# Patient Record
Sex: Female | Born: 1955 | Race: White | Hispanic: No | Smoking: Never smoker
Health system: Southern US, Community
[De-identification: ages and names within clinical notes are randomized; demographics above are authoritative.]

## PROBLEM LIST (undated history)

## (undated) DIAGNOSIS — E785 Hyperlipidemia, unspecified: Secondary | ICD-10-CM

## (undated) DIAGNOSIS — J8410 Pulmonary fibrosis, unspecified: Secondary | ICD-10-CM

## (undated) DIAGNOSIS — G4730 Sleep apnea, unspecified: Secondary | ICD-10-CM

## (undated) DIAGNOSIS — J45909 Unspecified asthma, uncomplicated: Secondary | ICD-10-CM

## (undated) DIAGNOSIS — F32A Depression, unspecified: Secondary | ICD-10-CM

## (undated) DIAGNOSIS — E669 Obesity, unspecified: Secondary | ICD-10-CM

## (undated) DIAGNOSIS — J84115 Respiratory bronchiolitis interstitial lung disease: Secondary | ICD-10-CM

## (undated) DIAGNOSIS — E119 Type 2 diabetes mellitus without complications: Secondary | ICD-10-CM

---

## 2011-12-19 DIAGNOSIS — M359 Systemic involvement of connective tissue, unspecified: Secondary | ICD-10-CM | POA: Insufficient documentation

## 2013-03-06 DIAGNOSIS — Z6841 Body Mass Index (BMI) 40.0 and over, adult: Secondary | ICD-10-CM | POA: Insufficient documentation

## 2013-09-16 DIAGNOSIS — J9611 Chronic respiratory failure with hypoxia: Secondary | ICD-10-CM | POA: Insufficient documentation

## 2014-03-03 DIAGNOSIS — J8410 Pulmonary fibrosis, unspecified: Secondary | ICD-10-CM | POA: Insufficient documentation

## 2014-11-20 DIAGNOSIS — J8489 Other specified interstitial pulmonary diseases: Secondary | ICD-10-CM | POA: Insufficient documentation

## 2015-09-05 DIAGNOSIS — G4733 Obstructive sleep apnea (adult) (pediatric): Secondary | ICD-10-CM | POA: Insufficient documentation

## 2015-10-08 DIAGNOSIS — K219 Gastro-esophageal reflux disease without esophagitis: Secondary | ICD-10-CM | POA: Insufficient documentation

## 2015-10-08 DIAGNOSIS — E063 Autoimmune thyroiditis: Secondary | ICD-10-CM | POA: Insufficient documentation

## 2015-10-08 DIAGNOSIS — E1169 Type 2 diabetes mellitus with other specified complication: Secondary | ICD-10-CM | POA: Insufficient documentation

## 2015-10-08 DIAGNOSIS — E7800 Pure hypercholesterolemia, unspecified: Secondary | ICD-10-CM | POA: Insufficient documentation

## 2015-10-08 DIAGNOSIS — E785 Hyperlipidemia, unspecified: Secondary | ICD-10-CM | POA: Insufficient documentation

## 2015-10-29 DIAGNOSIS — R0789 Other chest pain: Secondary | ICD-10-CM | POA: Insufficient documentation

## 2015-10-29 DIAGNOSIS — R0602 Shortness of breath: Secondary | ICD-10-CM | POA: Insufficient documentation

## 2015-11-01 DIAGNOSIS — F333 Major depressive disorder, recurrent, severe with psychotic symptoms: Secondary | ICD-10-CM | POA: Insufficient documentation

## 2015-11-01 DIAGNOSIS — Z8659 Personal history of other mental and behavioral disorders: Secondary | ICD-10-CM | POA: Insufficient documentation

## 2015-11-01 DIAGNOSIS — F22 Delusional disorders: Secondary | ICD-10-CM | POA: Insufficient documentation

## 2015-12-06 DIAGNOSIS — E119 Type 2 diabetes mellitus without complications: Secondary | ICD-10-CM | POA: Insufficient documentation

## 2015-12-06 DIAGNOSIS — D72829 Elevated white blood cell count, unspecified: Secondary | ICD-10-CM | POA: Insufficient documentation

## 2015-12-06 DIAGNOSIS — R0600 Dyspnea, unspecified: Secondary | ICD-10-CM | POA: Insufficient documentation

## 2018-04-24 DIAGNOSIS — R042 Hemoptysis: Secondary | ICD-10-CM | POA: Insufficient documentation

## 2019-01-13 DIAGNOSIS — M8589 Other specified disorders of bone density and structure, multiple sites: Secondary | ICD-10-CM | POA: Insufficient documentation

## 2019-01-13 DIAGNOSIS — Z13820 Encounter for screening for osteoporosis: Secondary | ICD-10-CM | POA: Insufficient documentation

## 2019-01-24 ENCOUNTER — Other Ambulatory Visit: Payer: Self-pay | Admitting: Specialist

## 2019-01-24 ENCOUNTER — Ambulatory Visit
Admission: RE | Admit: 2019-01-24 | Discharge: 2019-01-24 | Disposition: A | Discharge status: Home / Self Care | Payer: Medicare Other | Source: Ambulatory Visit | Attending: Specialist | Admitting: Specialist

## 2019-01-24 ENCOUNTER — Ambulatory Visit: Payer: Self-pay

## 2019-01-24 ENCOUNTER — Other Ambulatory Visit: Payer: Self-pay

## 2019-01-24 DIAGNOSIS — R042 Hemoptysis: Secondary | ICD-10-CM

## 2019-01-24 DIAGNOSIS — R0602 Shortness of breath: Secondary | ICD-10-CM

## 2019-01-24 HISTORY — DX: Type 2 diabetes mellitus without complications: E11.9

## 2019-01-24 HISTORY — DX: Unspecified asthma, uncomplicated: J45.909

## 2019-01-24 LAB — POCT I-STAT CREATININE: Creatinine, Ser: 1 mg/dL (ref 0.44–1.00)

## 2019-01-24 MED ORDER — IOHEXOL 350 MG/ML SOLN
75.0000 mL | Freq: Once | INTRAVENOUS | Status: AC | PRN
  Administered 2019-01-24: 75 mL via INTRAVENOUS

## 2019-09-19 ENCOUNTER — Other Ambulatory Visit: Payer: Self-pay

## 2019-09-19 ENCOUNTER — Telehealth (INDEPENDENT_AMBULATORY_CARE_PROVIDER_SITE_OTHER): Payer: Medicare Other | Admitting: Psychiatry

## 2019-09-19 ENCOUNTER — Encounter: Payer: Self-pay | Admitting: Psychiatry

## 2019-09-19 DIAGNOSIS — F4000 Agoraphobia, unspecified: Secondary | ICD-10-CM | POA: Diagnosis not present

## 2019-09-19 DIAGNOSIS — Z8659 Personal history of other mental and behavioral disorders: Secondary | ICD-10-CM | POA: Diagnosis not present

## 2019-09-19 DIAGNOSIS — F330 Major depressive disorder, recurrent, mild: Secondary | ICD-10-CM | POA: Insufficient documentation

## 2019-09-19 MED ORDER — BUPROPION HCL ER (XL) 150 MG PO TB24: 300.0000 mg | ORAL_TABLET | Freq: Every day | ORAL | 1 refills | Status: DC
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
## 2019-09-21 ENCOUNTER — Other Ambulatory Visit: Payer: Self-pay

## 2019-09-21 ENCOUNTER — Ambulatory Visit (INDEPENDENT_AMBULATORY_CARE_PROVIDER_SITE_OTHER): Payer: Medicare Other | Admitting: Licensed Clinical Social Worker

## 2019-09-21 DIAGNOSIS — F333 Major depressive disorder, recurrent, severe with psychotic symptoms: Secondary | ICD-10-CM | POA: Diagnosis not present

## 2019-09-21 NOTE — Progress Notes (Signed)
Virtual Visit via Video Note  I connected with Denise Knapp on 09/21/19 at 12:30 PM EDT by a video enabled telemedicine application and verified that I am speaking with the correct person using two identifiers.  Location: Patient: daughter's home Provider: [facility]   I discussed the limitations of evaluation and management by telemedicine and the availability of in person appointments. The patient expressed understanding and agreed to proceed.   I discussed the assessment and treatment plan with the patient. The patient was provided an opportunity to ask questions and all were answered. The patient agreed with the plan and demonstrated an understanding of the instructions.   The patient was advised to call back or seek an in-person evaluation if the symptoms worsen or if the condition fails to improve as anticipated.  I provided of non-face-to-face time during this encounter.    R , LCSW    THERAPIST PROGRESS NOTE  Session Time: 45 min  Participation Level: Active  Behavioral Response: Casual and NeatAlertDepressed  Type of Therapy: Individual Therapy  Treatment Goals addressed: Coping  Interventions: Supportive  Summary: Denise Knapp is a 64 y.o. female who presents with improving depression symptoms.  Pt feels medication changes are managing depression well. Allowed pt to explore interests and pleasurable activities.  Helped pt identify stress triggers: financial concerns, health concerns, and inability to clean home as well as she would like. Allowed pt to express thoughts and feelings triggered by divorce 29 years ago. Pt reports that she is very family-oriented and enjoys spending time with her mother, daughters, and grandchildren.  Encouraged self-care and cognitively stimulating activities. Follow up after daughter/new granddaughter visits in July. Pt will call back sooner prn. Revised tx plan.   Suicidal/Homicidal: No  Therapist Response: Pt  progressing well towards achieving goals of overall mood management. Pt reports more positive mood and increase in energy levels.  Plan: Return again in 6 weeks.  Diagnosis: Axis I: Major Depression, Recurrent severe    Axis II: No diagnosis    [name] , LCSW 09/21/2019

## 2019-09-28 ENCOUNTER — Telehealth: Payer: Self-pay

## 2019-09-28 DIAGNOSIS — F330 Major depressive disorder, recurrent, mild: Secondary | ICD-10-CM

## 2019-09-28 DIAGNOSIS — Z8659 Personal history of other mental and behavioral disorders: Secondary | ICD-10-CM

## 2019-09-28 NOTE — Telephone Encounter (Signed)
pt called states she on fluoxetine 20mg  and she has #47 pills left and she also take risperidon 2mg  and has #47 pill left .

## 2019-09-29 MED ORDER — FLUOXETINE HCL 20 MG PO CAPS: 20.0000 mg | ORAL_CAPSULE | Freq: Every day | ORAL | 0 refills | Status: DC

## 2019-09-29 MED ORDER — RISPERIDONE 2 MG PO TABS: 2.0000 mg | ORAL_TABLET | Freq: Every day | ORAL | 0 refills | Status: DC

## 2019-09-29 NOTE — Telephone Encounter (Signed)
I have sent refills for Prozac and risperidone to [facility].

## 2019-10-24 ENCOUNTER — Encounter: Admission: RE | Payer: Self-pay | Source: Home / Self Care

## 2019-10-24 ENCOUNTER — Ambulatory Visit: Admission: RE | Admit: 2019-10-24 | Payer: Medicare Other | Source: Home / Self Care | Admitting: Internal Medicine

## 2019-10-24 SURGERY — ESOPHAGOGASTRODUODENOSCOPY (EGD) WITH PROPOFOL
Anesthesia: General

## 2019-11-10 ENCOUNTER — Telehealth (INDEPENDENT_AMBULATORY_CARE_PROVIDER_SITE_OTHER): Payer: Medicare Other | Admitting: Psychiatry

## 2019-11-10 ENCOUNTER — Other Ambulatory Visit: Payer: Self-pay

## 2019-11-10 ENCOUNTER — Encounter: Payer: Self-pay | Admitting: Psychiatry

## 2019-11-10 DIAGNOSIS — F09 Unspecified mental disorder due to known physiological condition: Secondary | ICD-10-CM

## 2019-11-10 DIAGNOSIS — F0390 Unspecified dementia without behavioral disturbance: Secondary | ICD-10-CM

## 2019-11-10 DIAGNOSIS — F330 Major depressive disorder, recurrent, mild: Secondary | ICD-10-CM

## 2019-11-10 MED ORDER — BUPROPION HCL ER (XL) 150 MG PO TB24: 300.0000 mg | ORAL_TABLET | Freq: Every day | ORAL | 0 refills | Status: DC

## 2019-11-10 MED ORDER — RISPERIDONE 0.5 MG PO TABS: 0.5000 mg | ORAL_TABLET | Freq: Every day | ORAL | 0 refills | Status: DC

## 2019-11-10 NOTE — Progress Notes (Signed)
Provider Location : [facility] Patient Location : Home Virtual Visit via Video Note  I connected with Denise Knapp on 11/10/19 at  9:30 AM EDT by a video enabled telemedicine application and verified that I am speaking with the correct person using two identifiers.   I discussed the limitations of evaluation and management by telemedicine and the availability of in person appointments. The patient expressed understanding and agreed to proceed.    I discussed the assessment and treatment plan with the patient. The patient was provided an opportunity to ask questions and all were answered. The patient agreed with the plan and demonstrated an understanding of the instructions.   The patient was advised to call back or seek an in-person evaluation if the symptoms worsen or if the condition fails to improve as anticipated.   [facility] OP Progress Note  11/10/2019 8:12 PM Denise Knapp  MRN:  [id number]  Chief Complaint:  Chief Complaint    Follow-up     HPI: Denise Knapp is a 64 year old Caucasian female, retired, lives in [place], on [place], has a history of MDD, agoraphobia, hypothyroidism, obstructive sleep apnea, interstitial lung disease, diabetes melitis, pulmonary fibrosis, hyperlipidemia was evaluated by telemedicine today.  Patient reports since being on the higher dosage of Wellbutrin she has definitely noticed an improvement in her depressive symptoms.  She used to struggle with fatigue and tiredness during the day and also had excessive sleepiness during the day.  She reports since being on the Wellbutrin that has improved a lot.  Patient reports sleep is good.  Patient however today appeared to be preoccupied with possible paranoia.  She reports that her family members are always monitoring her closely to make sure she is mentally okay.  She reports that whenever she has conversations with someone in her family they go and talk about it to other family members.  She reports she has has to be very  careful about what she talks and reports that she is worried she might end up committed to a mental health facility if she does not talk the right words.  She reports she however is grateful for her family and reports her younger daughter, her mother as well as her older daughter who recently visited her are all supportive.  Patient denies any hallucinations.  Patient does report multiple medical problems and also reports short-term memory issues.  She is compliant on medications and denies side effects.  Patient denies any other concerns today.  Visit Diagnosis:    ICD-10-CM   1. MDD (major depressive disorder), recurrent episode, mild (HCC)  F33.0 risperiDONE (RISPERDAL) 0.5 MG tablet    buPROPion (WELLBUTRIN XL) 150 MG 24 hr tablet  2. 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 Cognitive disorder  F09     Past Psychiatric History: I have reviewed past psychiatric history from my progress note on 09/19/2019  Past Medical History:  Past Medical History:  Diagnosis Date  . Asthma   . Diabetes mellitus without complication ([facility])    History reviewed. No pertinent surgical history.  Family Psychiatric History: Reviewed family psychiatric history from my progress note on 09/19/2019  Family History:  Family History  Problem Relation Age of Onset  . Depression Mother   . Mental illness Daughter   . Mental illness Daughter     Social History: Reviewed social history from my progress note on 09/19/2019 Social History   Socioeconomic History  . Marital status: Legally Separated  Spouse name: Not on file  . Number of children: 2  . Years of education: Not on file  . Highest education level: Not on file  Occupational History  . Occupation: retired  Tobacco Use  . Smoking status: Never Smoker  . Smokeless tobacco: Never Used  Substance and Sexual Activity  . Alcohol use: Never  . Drug use: Never  . Sexual activity: Not Currently  Other  Topics Concern  . Not on file  Social History Narrative   Completed bachelors degree, Designer, jewellery   Worked for [employer] - for 30 years - SSD/SSI Claims work   On [employer]   Social Determinants of Radio broadcast assistant Strain:   . Difficulty of Paying Living Expenses:   Food Insecurity:   . Worried About Charity fundraiser in the Last Year:   . Arboriculturist in the Last Year:   Transportation Needs:   . Film/video editor (Medical):   Marland Kitchen Lack of Transportation (Non-Medical):   Physical Activity:   . Days of Exercise per Week:   . Minutes of Exercise per Session:   Stress:   . Feeling of Stress :   Social Connections:   . Frequency of Communication with Friends and Family:   . Frequency of Social Gatherings with Friends and Family:   . Attends Religious Services:   . Active Member of Clubs or Organizations:   . Attends Archivist Meetings:   Marland Kitchen Marital Status:     Allergies:  Allergies  Allergen Reactions  . Nintedanib Diarrhea and Nausea And Vomiting    Per pt report  . Penicillins Itching and Other (See Comments)    Yeast infection Pt reports no known drug allergies, but chart from [facility] shows Penicillins Reports hx of yeast infection during course of therapy. Yeast infection Yeast infection Pt reports no known drug allergies, but chart from [facility] shows Penicillins Pt reports no known drug allergies, but chart from [facility] shows Penicillins     Metabolic Disorder Labs: No results found for: HGBA1C, MPG No results found for: PROLACTIN No results found for: CHOL, TRIG, HDL, CHOLHDL, VLDL, LDLCALC No results found for: TSH  Therapeutic Level Labs: No results found for: LITHIUM No results found for: VALPROATE No components found for:  CBMZ  Current Medications: Current Outpatient Medications  Medication Sig Dispense Refill  . albuterol (ACCUNEB) 1.25 MG/3ML nebulizer solution     . albuterol (VENTOLIN HFA) 108 (90 Base) MCG/ACT inhaler  Inhale into the lungs.    Marland Kitchen alendronate (FOSAMAX) 35 MG tablet Take by mouth.    Marland Kitchen ascorbic acid (VITAMIN C) 100 MG tablet Take by mouth.    Marland Kitchen aspirin 81 MG EC tablet Take by mouth.    Marland Kitchen b complex vitamins tablet Take by mouth.    . Blood Glucose Monitoring Suppl (FIFTY50 GLUCOSE METER 2.0) w/Device KIT Use as instructed    . Blood Glucose Monitoring Suppl (ONETOUCH VERIO) w/Device KIT Use to test blood sugar    . budesonide (PULMICORT) 0.5 MG/2ML nebulizer solution     . budesonide (PULMICORT) 0.5 MG/2ML nebulizer solution Inhale into the lungs.    Marland Kitchen buPROPion (WELLBUTRIN XL) 150 MG 24 hr tablet Take 2 tablets (300 mg total) by mouth daily with breakfast. 180 tablet 0  . Calcium Carbonate-Vitamin D 600-200 MG-UNIT TABS Take by mouth.    . Cholecalciferol 25 MCG (1000 UT) capsule Take by mouth.    . Cysteamine Bitartrate (PROCYSBI) 300 MG  PACK Use 1 each once daily Use as instructed.  ACCU-CHEK AVIVA LANCETS WITH LANCING DEVICE E11.9    . Dulaglutide 1.5 MG/0.5ML SOPN Inject into the skin.    Marland Kitchen enalapril (VASOTEC) 2.5 MG tablet Take 2.5 mg by mouth daily.    . enalapril (VASOTEC) 2.5 MG tablet Take by mouth.    . ESBRIET 267 MG TABS     . famotidine (PEPCID) 40 MG tablet Take 40 mg by mouth daily.    Marland Kitchen FLUoxetine (PROZAC) 20 MG capsule Take 1 capsule (20 mg total) by mouth daily. 90 capsule 0  . fluticasone (FLONASE) 50 MCG/ACT nasal spray Place into the nose.    Marland Kitchen glucose blood (ONETOUCH VERIO) test strip 1 each (1 strip total) by XX route once daily Use as instructed.    Marland Kitchen glucose blood (PRECISION QID TEST) test strip by Other route 2 (two) times a day with meals. Please dispense 50 ultra fine lancets    . glucose blood (PRECISION QID TEST) test strip Use once daily Use as instructed.  ACCU-CHEK AVIVA PLUS TEST STRIPS E11.9    . guaifenesin (ROBITUSSIN) 100 MG/5ML syrup Take by mouth.    Marland Kitchen ibuprofen (ADVIL) 200 MG tablet Take by mouth.    . levothyroxine (SYNTHROID) 137 MCG tablet Take by  mouth.    . loperamide (IMODIUM) 2 MG capsule Take by mouth.    . lovastatin (MEVACOR) 40 MG tablet Take 40 mg by mouth daily.    . meclizine (ANTIVERT) 25 MG tablet Take by mouth.    . metFORMIN (GLUCOPHAGE) 500 MG tablet Take 500 mg by mouth 3 (three) times daily.    . metFORMIN (GLUCOPHAGE) 500 MG tablet TAKE 2 TABLETS BY MOUTH IN THE MORNING AND 1 TABLET IN THE EVENING    . Multiple Vitamin (MULTI-VITAMIN) tablet Take by mouth.    . mycophenolate (CELLCEPT) 500 MG tablet Take 1,000 mg by mouth 2 (two) times daily.    . mycophenolate (CELLCEPT) 500 MG tablet TAKE 2 TABLETS BY MOUTH 2 TIMES DAILY    . OFEV 150 MG CAPS     . omeprazole (PRILOSEC) 20 MG capsule Take by mouth.    Glory Rosebush Delica Lancets 11A MISC     . OneTouch Delica Lancets 57X MISC Use 1 each once daily Use as instructed.    Glory Rosebush ULTRA test strip     . ONETOUCH VERIO test strip 1 each daily.    . Pirfenidone (ESBRIET) 267 MG TABS Take by mouth.    . predniSONE (DELTASONE) 10 MG tablet     . risperiDONE (RISPERDAL) 0.5 MG tablet Take 1 tablet (0.5 mg total) by mouth daily. Take in combination with 2 mg daily 90 tablet 0  . risperiDONE (RISPERDAL) 2 MG tablet Take 1 tablet (2 mg total) by mouth at bedtime. 90 tablet 0  . sodium chloride (OCEAN) 0.65 % nasal spray Place into the nose.    . sulfamethoxazole-trimethoprim (BACTRIM DS) 800-160 MG tablet TAKE 1 TABLET(160MG OF     TRIMETHOPRIM TOTAL) THREE  TIMES WEEKLY    . TRULICITY 1.5 UX/8.3FX SOPN SMARTSIG:0.5 Milliliter(s) SUB-Q Once a Week    . Vitamin D, Ergocalciferol, (DRISDOL) 1.25 MG (50000 UNIT) CAPS capsule      No current facility-administered medications for this visit.     Musculoskeletal: Strength & Muscle Tone: UTA Gait & Station: normal Patient leans: N/A  Psychiatric Specialty Exam: Review of Systems  Constitutional: Positive for fatigue.  Psychiatric/Behavioral: Positive for dysphoric mood.  Negative for agitation, behavioral problems,  confusion, decreased concentration, hallucinations, self-injury, sleep disturbance and suicidal ideas. The patient is nervous/anxious.   All other systems reviewed and are negative.   There were no vitals taken for this visit.There is no height or weight on file to calculate BMI.  General Appearance: Casual  Eye Contact:  Fair  Speech:  Normal Rate  Volume:  Normal  Mood:  Anxious and Depressed improving  Affect:  Appropriate  Thought Process:  Goal Directed and Descriptions of Associations: Intact  Orientation:  Full (Time, Place, and Person)  Thought Content: Paranoid Ideation and Rumination   Suicidal Thoughts:  No  Homicidal Thoughts:  No  Memory:  Immediate;   Fair Recent;   Fair Remote;   Fair  Judgement:  Fair  Insight:  Fair  Psychomotor Activity:  Normal  Concentration:  Concentration: Fair and Attention Span: Fair  Recall:  [employer] of Knowledge: Fair  Language: Fair  Akathisia:  No  Handed:  Right  AIMS (if indicated): UTA  Assets:  Communication Skills Desire for Improvement Social Support  ADL's:  Intact  Cognition: WNL  Sleep:  Fair   Screenings: PHQ2-9     Video Visit from 09/19/2019 in [facility]  PHQ-2 Total Score 1  PHQ-9 Total Score 11       Assessment and Plan: Tyjah Hai is a 64 year old divorced, on SSD, Caucasian female, lives in [place] has a history of MDD with psychosis, pulmonary fibrosis, hypothyroidism, interstitial lung disease, obstructive sleep apnea, diabetes melitis, hyperlipidemia, morbid obesity was evaluated by telemedicine today.  She is biologically predisposed given her family history, multiple health issues and her history of trauma.  Patient with psychosocial stressors of health problems, current pandemic and relationship struggles.  Patient continues to make progress on the current medication regimen however today appeared to be preoccupied with paranoid ideation, unknown if this is chronic.   Patient however could be redirected and at this time does not seem to be a danger to herself or anyone else.  Discussed plan as noted below.  Plan MDD-improving Prozac 20 mg p.o. daily Risperidone, will increase to 2.5 mg p.o. nightly.   Psychosis unspecified-unstable Unknown if this is due to her depression, or due to cognitive issues. Increase risperidone to 2.5 mg p.o. nightly We will consider getting collateral information from her daughter.  Patient agrees to have her daughter during the next session.  Cognitive disorder unspecified-rule out dementia Will refer to neurology. Unknown if her current paranoia is related to early cognitive changes.   Patient to continue psychotherapy sessions with therapist Ms.Hussami.  Follow-up in clinic in 4 weeks or sooner if needed.  I have spent atleast 20 minutes non face to face with patient today. More than 50 % of the time was spent for preparing to see the patient ( e.g., review of test, records ),  ordering medications and test ,psychoeducation and supportive psychotherapy and care coordination,as well as documenting clinical information in electronic health record. This note was generated in part or whole with voice recognition software. Voice recognition is usually quite accurate but there are transcription errors that can and very often do occur. I apologize for any typographical errors that were not detected and corrected.       Ursula Alert, MD 11/10/2019, 8:12 PM

## 2019-11-11 ENCOUNTER — Telehealth: Payer: Self-pay

## 2019-11-11 NOTE — Telephone Encounter (Signed)
faxed and confirmed orders for new pt referral  Pending appt

## 2019-11-24 ENCOUNTER — Ambulatory Visit: Payer: Medicare Other | Admitting: Licensed Clinical Social Worker

## 2019-11-28 ENCOUNTER — Ambulatory Visit (INDEPENDENT_AMBULATORY_CARE_PROVIDER_SITE_OTHER): Payer: Medicare Other | Admitting: Licensed Clinical Social Worker

## 2019-11-28 ENCOUNTER — Other Ambulatory Visit: Payer: Self-pay

## 2019-11-28 DIAGNOSIS — F330 Major depressive disorder, recurrent, mild: Secondary | ICD-10-CM | POA: Diagnosis not present

## 2019-11-28 DIAGNOSIS — Z8659 Personal history of other mental and behavioral disorders: Secondary | ICD-10-CM | POA: Diagnosis not present
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
## 2019-11-29 ENCOUNTER — Other Ambulatory Visit: Payer: Self-pay | Admitting: Obstetrics & Gynecology

## 2019-11-29 DIAGNOSIS — Z1231 Encounter for screening mammogram for malignant neoplasm of breast: Secondary | ICD-10-CM

## 2019-12-12 ENCOUNTER — Other Ambulatory Visit: Payer: Self-pay

## 2019-12-12 ENCOUNTER — Ambulatory Visit (INDEPENDENT_AMBULATORY_CARE_PROVIDER_SITE_OTHER): Payer: Medicare Other | Admitting: Licensed Clinical Social Worker

## 2019-12-12 DIAGNOSIS — F330 Major depressive disorder, recurrent, mild: Secondary | ICD-10-CM
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
## 2019-12-21 ENCOUNTER — Other Ambulatory Visit: Payer: Self-pay | Admitting: Psychiatry

## 2019-12-21 DIAGNOSIS — F330 Major depressive disorder, recurrent, mild: Secondary | ICD-10-CM

## 2019-12-23 ENCOUNTER — Other Ambulatory Visit: Payer: Self-pay

## 2019-12-23 ENCOUNTER — Encounter: Payer: Self-pay | Admitting: Psychiatry

## 2019-12-23 ENCOUNTER — Telehealth (INDEPENDENT_AMBULATORY_CARE_PROVIDER_SITE_OTHER): Payer: Medicare Other | Admitting: Psychiatry

## 2019-12-23 DIAGNOSIS — F0390 Unspecified dementia without behavioral disturbance: Secondary | ICD-10-CM

## 2019-12-23 DIAGNOSIS — F330 Major depressive disorder, recurrent, mild: Secondary | ICD-10-CM | POA: Diagnosis not present

## 2019-12-23 DIAGNOSIS — F09 Unspecified mental disorder due to known physiological condition: Secondary | ICD-10-CM | POA: Diagnosis not present

## 2019-12-23 NOTE — Progress Notes (Signed)
Provider Location : [facility] Patient Location : Home  Participants: Patient , Provider  Virtual Visit via Telephone Note  I connected with Denise Knapp on 12/23/19 at 11:40 AM EDT by telephone and verified that I am speaking with the correct person using two identifiers.   I discussed the limitations, risks, security and privacy concerns of performing an evaluation and management service by telephone and the availability of in person appointments. I also discussed with the patient that there may be a patient responsible charge related to this service. The patient expressed understanding and agreed to proceed.    I discussed the assessment and treatment plan with the patient. The patient was provided an opportunity to ask questions and all were answered. The patient agreed with the plan and demonstrated an understanding of the instructions.   The patient was advised to call back or seek an in-person evaluation if the symptoms worsen or if the condition fails to improve as anticipated.    [place] [facility] MD OP Progress Note  12/23/2019 11:55 AM Denise Knapp  MRN:  [id number]  Chief Complaint:  Chief Complaint    Follow-up     HPI: Denise Knapp is a 64 year old Caucasian female, retired, lives in [place], on [place], has a history of MDD, opiate, hypothyroidism, obstructive sleep apnea, interstitial lung disease, diabetes melitis, fibrosis, hyperlipidemia was evaluated by phone today.  Patient today reports her mood symptoms as improving on the current medication regimen.  She is tolerating the risperidone well.  She reports the higher dosage helps her to sleep better at night.  She denies any suicidality, homicidality or hallucinations.  She does have paranoia however she today reports that is chronic.  She reports she has felt that way all her life.  She reports she does not believe she is currently unable to cope with these thoughts and is able to manage it well.  She often believes people are talking to  her to get her to speak and then go and communicate what she spoke about to other family members.  She reports she manages by staying away and not talking to people much.  She is currently having some relationship struggles with her daughter who lives in town.  She however reports she is coping with it and is hopeful it will get better.  Patient apologized for not having her daughter during her call today.  She however agrees to have her daughter present for more collateral information during her next visit.  She continues to be in therapy.  Patient denies any other concerns today.    Visit Diagnosis: R/O Schizoaffective disorder   ICD-10-CM   1. MDD (major depressive disorder), recurrent episode, mild ([place])  F33.0   2. 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 Cognitive disorder  F09     Past Psychiatric History: I have reviewed past psychiatric history progress note on 09/19/2019  Past Medical History:  Past Medical History:  Diagnosis Date  . Asthma   . Diabetes mellitus without complication ([place])    No past surgical history on file.  Family Psychiatric History: I have reviewed family psychiatric history from my progress note from 09/19/2019  Family History:  Family History  Problem Relation Age of Onset  . Depression Mother   . Mental illness Daughter   . Mental illness Daughter     Social History: Reviewed social history from my progress note from 09/19/2019 Social History   Socioeconomic History  . Marital status: Legally Separated  Spouse name: Not on file  . Number of children: 2  . Years of education: Not on file  . Highest education level: Not on file  Occupational History  . Occupation: retired  Tobacco Use  . Smoking status: Never Smoker  . Smokeless tobacco: Never Used  Substance and Sexual Activity  . Alcohol use: Never  . Drug use: Never  . Sexual activity: Not Currently  Other Topics Concern  . 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 Difficulty of Paying Living Expenses: Somewhat hard  Food Insecurity:   . Worried About Charity fundraiser in the Last Year: Not on file  . Ran Out of Food in the Last Year: Not on file  Transportation Needs:   . Lack of Transportation (Medical): Not on file  . Lack of Transportation (Non-Medical): Not on file  Physical Activity:   . Days of Exercise per Week: Not on file  . Minutes of Exercise per Session: Not on file  Stress:   . Feeling of Stress : Not on file  Social Connections:   . Frequency of Communication with Friends and Family: Not on file  . Frequency of Social Gatherings with Friends and Family: Not on file  . Attends Religious Services: Not on file  . Active Member of Clubs or Organizations: Not on file  . Attends Archivist Meetings: Not on file  . Marital Status: Not on file    Allergies:  Allergies  Allergen Reactions  . Nintedanib Diarrhea and Nausea And Vomiting    Per pt report  . Penicillins Itching and Other (See Comments)    Yeast infection Pt reports no known drug allergies, but chart from [facility] shows Penicillins Reports hx of yeast infection during course of therapy. Yeast infection Yeast infection Pt reports no known drug allergies, but chart from [facility] shows Penicillins Pt reports no known drug allergies, but chart from [facility] shows Penicillins     Metabolic Disorder Labs: No results found for: HGBA1C, MPG No results found for: PROLACTIN No results found for: CHOL, TRIG, HDL, CHOLHDL, VLDL, LDLCALC No results found for: TSH  Therapeutic Level Labs: No results found for: LITHIUM No results found for: VALPROATE No components found for:  CBMZ  Current Medications: Current Outpatient Medications  Medication Sig Dispense  Refill  . albuterol (ACCUNEB) 1.25 MG/3ML nebulizer solution     . albuterol (VENTOLIN HFA) 108 (90 Base) MCG/ACT inhaler Inhale into the lungs.    [place] Kitchen alendronate (FOSAMAX) 35 MG tablet Take by mouth.    Mar[place] Kitchen amoxicillin (AMOXIL) 500 MG capsule Take 500 mg by mouth 3 (three) times daily.    Marland Kitchen ascorbic acid (VITAMIN C) 100 MG tablet Take by mouth.    Marland Kitchen aspirin 81 MG EC tablet Take by mouth.    Marland Kitchen b complex vitamins tablet Take by mouth.    . Blood Glucose Monitoring Suppl (FIFTY50 GLUCOSE METER 2.0) w/Device KIT Use as instructed    . Blood Glucose Monitoring Suppl (ONETOUCH VERIO) w/Device KIT Use to test blood sugar    . budesonide (PULMICORT) 0.5 MG/2ML nebulizer solution     . budesonide (PULMICORT) 0.5 MG/2ML nebulizer solution Inhale into the lungs.    Mar[place] Kitchen buPROPion (WELLBUTRIN XL) 150 MG 24 hr tablet Take  2 tablets (300 mg total) by mouth daily with breakfast. 180 tablet 0  . Calcium Carbonate-Vitamin D 600-200 MG-UNIT TABS Take by mouth.    . Cholecalciferol 25 MCG (1000 UT) capsule Take by mouth.    . Cysteamine Bitartrate (PROCYSBI) 300 MG PACK Use 1 each once daily Use as instructed.  ACCU-CHEK AVIVA LANCETS WITH LANCING DEVICE E11.9    . Dulaglutide 1.5 MG/0.5ML SOPN Inject into the skin.    Mar[place] Kitchen enalapril (VASOTEC) 2.5 MG tablet Take 2.5 mg by mouth daily.    . enalapril (VASOTEC) 2.5 MG tablet Take by mouth.    . ESBRIET 267 MG TABS     . famotidine (PEPCID) 40 MG tablet Take 40 mg by mouth daily.    . fluconazole (DIFLUCAN) 100 MG tablet Take by mouth.    Marland Kitchen FLUoxetine (PROZAC) 20 MG capsule TAKE 1 CAPSULE BY MOUTH EVERY DAY 90 capsule 0  . fluticasone (FLONASE) 50 MCG/ACT nasal spray Place into the nose.    Marland Kitchen glucose blood (ONETOUCH VERIO) test strip 1 each (1 strip total) by XX route once daily Use as instructed.    Mar[place] Kitchen glucose blood (PRECISION QID TEST) test strip by Other route 2 (two) times a day with meals. Please dispense 50 ultra fine lancets    . glucose blood (PRECISION QID TEST)  test strip Use once daily Use as instructed.  ACCU-CHEK AVIVA PLUS TEST STRIPS E11.9    . guaifenesin (ROBITUSSIN) 100 MG/5ML syrup Take by mouth.    Mar[place] Kitchen ibuprofen (ADVIL) 200 MG tablet Take by mouth.    . levothyroxine (SYNTHROID) 137 MCG tablet Take by mouth.    . loperamide (IMODIUM) 2 MG capsule Take by mouth.    . lovastatin (MEVACOR) 40 MG tablet Take 40 mg by mouth daily.    . meclizine (ANTIVERT) 25 MG tablet Take by mouth.    . metFORMIN (GLUCOPHAGE) 500 MG tablet Take 500 mg by mouth 3 (three) times daily.    . metFORMIN (GLUCOPHAGE) 500 MG tablet TAKE 2 TABLETS BY MOUTH IN THE MORNING AND 1 TABLET IN THE EVENING    . Multiple Vitamin (MULTI-VITAMIN) tablet Take by mouth.    . mycophenolate (CELLCEPT) 500 MG tablet Take 1,000 mg by mouth 2 (two) times daily.    . mycophenolate (CELLCEPT) 500 MG tablet TAKE 2 TABLETS BY MOUTH 2 TIMES DAILY    . OFEV 150 MG CAPS     . omeprazole (PRILOSEC) 20 MG capsule Take by mouth.    Denise Knapp     . OneTouch Delica Lancets 84X Knapp Use 1 each once daily Use as instructed.    Denise Rosebush ULTRA test strip     . ONETOUCH VERIO test strip 1 each daily.    . Pirfenidone (ESBRIET) 267 MG TABS Take by mouth.    . predniSONE (DELTASONE) 10 MG tablet     . risperiDONE (RISPERDAL) 0.5 MG tablet Take 1 tablet (0.5 mg total) by mouth daily. Take in combination with 2 mg daily 90 tablet 0  . risperiDONE (RISPERDAL) 2 MG tablet Take 1 tablet (2 mg total) by mouth at bedtime. 90 tablet 0  . sodium chloride (OCEAN) 0.65 % nasal spray Place into the nose.    . sulfamethoxazole-trimethoprim (BACTRIM DS) 800-160 MG tablet TAKE 1 TABLET(160MG OF     TRIMETHOPRIM TOTAL) THREE  TIMES WEEKLY    . traMADol (ULTRAM) 50 MG tablet Take 50 mg by mouth every 6 (six) hours as  needed.    . TRULICITY 1.5 PP/5.0DT SOPN SMARTSIG:0.5 Milliliter(s) SUB-Q Once a Week    . Vitamin D, Ergocalciferol, (DRISDOL) 1.25 MG (50000 UNIT) CAPS capsule      No current  facility-administered medications for this visit.     Musculoskeletal: Strength & Muscle Tone: UTA Gait & Station: UTA Patient leans: N/A  Psychiatric Specialty Exam: Review of Systems  Psychiatric/Behavioral: Negative for agitation, behavioral problems, confusion, decreased concentration, dysphoric mood, hallucinations, self-injury, sleep disturbance and suicidal ideas. The patient is not nervous/anxious and is not hyperactive.   All other systems reviewed and are negative.   There were no vitals taken for this visit.There is no height or weight on file to calculate BMI.  General Appearance: UTA  Eye Contact:  UTA  Speech:  Clear and Coherent  Volume:  Normal  Mood:  Euthymic  Affect:  UTA  Thought Process:  Goal Directed and Descriptions of Associations: Intact  Orientation:  Full (Time, Place, and Person)  Thought Content: Paranoid Ideation chronic  Suicidal Thoughts:  No  Homicidal Thoughts:  No  Memory:  Immediate;   Fair Recent;   Fair Remote;   Fair  Judgement:  Fair  Insight:  Fair  Psychomotor Activity:  UTA  Concentration:  Concentration: Fair and Attention Span: Fair  Recall:  [employer] of Knowledge: Fair  Language: Fair  Akathisia:  No  Handed:  Right  AIMS (if indicated): UTA  Assets:  Communication Skills Desire for Improvement Housing  ADL's:  Intact  Cognition: WNL  Sleep:  Fair   Screenings: GAD-7     Counselor from 11/28/2019 in [facility]  Total GAD-7 Score 13    PHQ2-9     Counselor from 11/28/2019 in [facility] Video Visit from 09/19/2019 in [facility]  PHQ-2 Total Score 1 1  PHQ-9 Total Score 10 11       Assessment and Plan: Denise Knapp is a 64 year old divorced, on SSD, patient.  Lives in [place] has a history of MDD), hypothyroidism, interstitial lung disease, obstructive sleep apnea, morbid obesity was evaluated by telemedicine today.  Patient  with biological predisposition given her family history, multiple health issues, history of trauma.  Patient with psychosocial stressors of health problems, current pandemic and relationship struggles.  Patient however continues to be preoccupied with paranoia, likely chronic.  Patient however is currently doing well on the current medication regimen.  Discussed plan as noted below.  Plan MDD-stable Prozac 20 mg p.o. daily Risperidone 2.5 mg nightly  Psychosis -rule out schizoaffective disorder. Will need collateral information however unable to reach family members.  Patient agrees to have her daughter present in session next visit. Risperidone 2.5 mg p.o. nightly   Cognitive disorder unspecified-rule out dementia Refered to neurology- pending  Continue psychotherapy sessions with Ms.Hussami.  Follow-up in clinic in 1 month or sooner if needed.  I have spent atleast 20 minutes face to face with patient today. More than 50 % of the time was spent for obtaining and to review and separately obtained history , ordering medications and test ,psychoeducation and supportive psychotherapy and care coordination,as well as documenting clinical information in electronic health record. This note was generated in part or whole with voice recognition software. Voice recognition is usually quite accurate but there are transcription errors that can and very often do occur. I apologize for any typographical errors that were not detected and corrected.      Ursula Alert, MD 12/23/2019,  11:55 AM

## 2020-01-03 ENCOUNTER — Ambulatory Visit (INDEPENDENT_AMBULATORY_CARE_PROVIDER_SITE_OTHER): Payer: Medicare Other | Admitting: Licensed Clinical Social Worker

## 2020-01-03 ENCOUNTER — Other Ambulatory Visit: Payer: Self-pay

## 2020-01-03 DIAGNOSIS — F330 Major depressive disorder, recurrent, mild: Secondary | ICD-10-CM | POA: Diagnosis not present

## 2020-01-03 NOTE — Progress Notes (Signed)
Virtual Visit via Telephone Note  I connected with Denise Knapp on 01/03/20 at 12:30 PM EDT by telephone and verified that I am speaking with the correct person using two identifiers.  Location: Patient: home Provider: [facility]   I discussed the limitations, risks, security and privacy concerns of performing an evaluation and management service by telephone and the availability of in person appointments. I also discussed with the patient that there may be a patient responsible charge related to this service. The patient expressed understanding and agreed to proceed.  The patient was advised to call back or seek an in-person evaluation if the symptoms worsen or if the condition fails to improve as anticipated.  I provided 30 minutes of non-face-to-face time during this encounter.    R , LCSW   THERAPIST PROGRESS NOTE  Session Time: 12:30-1:00p  Participation Level: Active  Behavioral Response: NAAlertgenerally pleasant  Type of Therapy: Individual Therapy  Treatment Goals addressed: Anxiety and Coping  Interventions: Supportive  Summary: Denise Knapp is a 64 y.o. female who presents with improving symptoms related to depression diagnosis. Pt reports that mood is stable and that she is managing anxiety symptoms better than in the past.   Allowed pt to explore and express thoughts and feelings about current stressors:  Pt worries about quality of daughter's marriages.  "they don't tell me anything at all about their relationships". Discussed relationship privacy and how others just prefer that family members not know their personal lives.   Pt reports that she has some physical limitations to leaving her home--can't go out without large oxygen tank.   Encouraged pt to continue focusing on maintaining levels of progress, self care, social engagement (family/friends) and cognitively stimulating activities/leaving the house when she can.  Suicidal/Homicidal: No  SI, HI, or  AVH reported at time of session.  Therapist Response: [name] reports that mood is stable with no active depression symptoms and that she is able to manage anxiety symptoms. This is indicative of progress.   Plan: Return again in 6 weeks. Ongoing treatment plan to include maintaining progress, improving coping skills, and focus on self care/life balance.   Diagnosis: Axis I: Major Depression, Recurrent mild    Axis II: No diagnosis    [name] , LCSW 01/03/2020

## 2020-01-24 ENCOUNTER — Telehealth: Payer: Self-pay

## 2020-01-24 DIAGNOSIS — F330 Major depressive disorder, recurrent, mild: Secondary | ICD-10-CM

## 2020-01-24 MED ORDER — BUPROPION HCL ER (XL) 150 MG PO TB24: 300.0000 mg | ORAL_TABLET | Freq: Every day | ORAL | 0 refills | Status: DC

## 2020-01-24 NOTE — Telephone Encounter (Signed)
pt called left message that she needs refill on bupropion

## 2020-01-24 NOTE — Telephone Encounter (Signed)
I have sent Wellbutrin to pharmacy. 

## 2020-01-31 ENCOUNTER — Other Ambulatory Visit: Payer: Self-pay | Admitting: Psychiatry

## 2020-01-31 DIAGNOSIS — F330 Major depressive disorder, recurrent, mild: Secondary | ICD-10-CM

## 2020-02-10 ENCOUNTER — Encounter: Payer: Self-pay | Admitting: Psychiatry

## 2020-02-10 ENCOUNTER — Telehealth (INDEPENDENT_AMBULATORY_CARE_PROVIDER_SITE_OTHER): Payer: Medicare Other | Admitting: Psychiatry

## 2020-02-10 ENCOUNTER — Other Ambulatory Visit: Payer: Self-pay

## 2020-02-10 DIAGNOSIS — F0390 Unspecified dementia without behavioral disturbance: Secondary | ICD-10-CM | POA: Diagnosis not present

## 2020-02-10 DIAGNOSIS — F330 Major depressive disorder, recurrent, mild: Secondary | ICD-10-CM

## 2020-02-10 DIAGNOSIS — F09 Unspecified mental disorder due to known physiological condition: Secondary | ICD-10-CM

## 2020-02-10 MED ORDER — RISPERIDONE 2 MG PO TABS: 2.0000 mg | ORAL_TABLET | Freq: Every day | ORAL | 0 refills | Status: DC

## 2020-02-10 MED ORDER — FLUOXETINE HCL 40 MG PO CAPS: 40.0000 mg | ORAL_CAPSULE | Freq: Every day | ORAL | 0 refills | Status: DC
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
## 2020-02-16 ENCOUNTER — Ambulatory Visit (INDEPENDENT_AMBULATORY_CARE_PROVIDER_SITE_OTHER): Payer: Medicare Other | Admitting: Licensed Clinical Social Worker

## 2020-02-16 ENCOUNTER — Other Ambulatory Visit: Payer: Self-pay

## 2020-02-16 DIAGNOSIS — F330 Major depressive disorder, recurrent, mild: Secondary | ICD-10-CM | POA: Diagnosis not present

## 2020-02-16 NOTE — Progress Notes (Signed)
Virtual Visit via Telephone Note  I connected with Denise Knapp on 02/16/20 at  1:30 PM EDT by telephone and verified that I am speaking with the correct person using two identifiers.  Location: Patient: home Provider: [facility]   I discussed the limitations, risks, security and privacy concerns of performing an evaluation and management service by telephone and the availability of in person appointments. I also discussed with the patient that there may be a patient responsible charge related to this service. The patient expressed understanding and agreed to proceed.   The patient was advised to call back or seek an in-person evaluation if the symptoms worsen or if the condition fails to improve as anticipated.  I provided 45 minutes of non-face-to-face time during this encounter.    R , LCSW   THERAPIST PROGRESS NOTE  Session Time: 1:30-2:15p  Participation Level: Active  Behavioral Response: NAAlertgenerally pleasant; positive  Type of Therapy: Individual Therapy  Treatment Goals addressed: Coping  Interventions: Supportive  Summary: Denise Knapp is a 64 y.o. female who presents with improving symptoms related to depression diagnosis. Pt reports that she is compliant with medications, stable mood, and getting good quality and quantity of sleep.  Explored family relationships, daily structure, eating patterns, traumatic past events, grief (father), and psychological impact of health-related concerns.  Continued to encourage pts independence, self care, and life balance.   Suicidal/Homicidal: No  Therapist Response: Taylah reports that she is continuing to maintain progress and is demonstrating a greater capacity of overall self care. Larue reports that she is able to develop, maintain, and respect personal boundaries.   Plan: Return again in 5 weeks. The ongoing treatment plan includes maintaining current levels of progress and continuing to build skills to manage  mood, improve stress/anxiety management, emotion regulation, distress tolerance, and behavior modification.   Diagnosis: Axis I: Major Depression, Recurrent, mild    Axis II: No diagnosis    Denise Knapp , LCSW 02/16/2020

## 2020-03-15 ENCOUNTER — Telehealth (INDEPENDENT_AMBULATORY_CARE_PROVIDER_SITE_OTHER): Payer: Medicare Other | Admitting: Psychiatry

## 2020-03-15 ENCOUNTER — Other Ambulatory Visit: Payer: Self-pay

## 2020-03-15 ENCOUNTER — Telehealth: Payer: Medicare Other | Admitting: Psychiatry

## 2020-03-15 ENCOUNTER — Encounter: Payer: Self-pay | Admitting: Psychiatry

## 2020-03-15 DIAGNOSIS — F3342 Major depressive disorder, recurrent, in full remission: Secondary | ICD-10-CM | POA: Diagnosis not present

## 2020-03-15 DIAGNOSIS — F09 Unspecified mental disorder due to known physiological condition: Secondary | ICD-10-CM | POA: Diagnosis not present

## 2020-03-15 NOTE — Progress Notes (Signed)
Virtual Visit via Telephone Note  I connected with Denise Knapp on 03/15/20 at 11:00 AM EST by telephone and verified that I am speaking with the correct person using two identifiers.  Location Provider Location : [facility] Patient Location : Home  Participants: Patient , Provider   I discussed the limitations, risks, security and privacy concerns of performing an evaluation and management service by telephone and the availability of in person appointments. I also discussed with the patient that there may be a patient responsible charge related to this service. The patient expressed understanding and agreed to proceed.    I discussed the assessment and treatment plan with the patient. The patient was provided an opportunity to ask questions and all were answered. The patient agreed with the plan and demonstrated an understanding of the instructions.   The patient was advised to call back or seek an in-person evaluation if the symptoms worsen or if the condition fails to improve as anticipated.   [facility] MD OP Progress Note  03/15/2020 11:13 AM Linzi Ohlinger  MRN:  [id number]  Chief Complaint:  Chief Complaint    Follow-up     HPI: Denise Knapp is a 64 year old Caucasian female, retired, lives in [place], on [place], has a history of MDD, psychosis, obstructive sleep apnea, hypothyroidism, interstitial lung disease, diabetes melitis, hyperlipidemia was evaluated by telemedicine today.  Patient today reports she is currently making progress.  She reports her medications are effective.  The addition of the low dosage of risperidone did make a difference.  She is sleeping better.  She has noticed some grogginess in the morning some days however it does not last too long.  She reports she was able to talk to Dr. Melrose Nakayama, neurologist for her memory changes.  She was started on Aricept.  She reports she is going to start taking the medication soon.  She denies any suicidality, homicidality.  She did not  appear to be preoccupied with paranoia or delusions today.  She looks forward to Christmas holidays and reports her daughter from [place] is coming to visit.  She is excited about that.  She denies any other concerns today.  Visit Diagnosis:    ICD-10-CM   1. MDD (major depressive disorder), recurrent, in full remission ([place])  F33.42   2. Cognitive disorder  F09     Past Psychiatric History: I have reviewed past psychiatric history from my progress note on 09/19/2019  Past Medical History:  Past Medical History:  Diagnosis Date  . Asthma   . Diabetes mellitus without complication ([place])    History reviewed. No pertinent surgical history.  Family Psychiatric History: Reviewed family psychiatric history from my progress note on 09/19/2019  Family History:  Family History  Problem Relation Age of Onset  . Depression Mother   . Mental illness Daughter   . Mental illness Daughter     Social History: Reviewed social history from my progress note on 09/19/2019 Social History   Socioeconomic History  . Marital status: Legally Separated    Spouse name: Not on file  . Number of children: 2  . Years of education: Not on file  . Highest education level: Not on file  Occupational History  . Occupation: retired  Tobacco Use  . Smoking status: Never Smoker  . Smokeless tobacco: Never Used  Substance and Sexual Activity  . Alcohol use: Never  . Drug use: Never  . Sexual activity: Not Currently  Other Topics Concern  . 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 Difficulty of Paying Living Expenses: Somewhat hard  Food Insecurity:   . Worried About Charity fundraiser in the Last Year: Not on file  . Ran Out of Food in the Last Year: Not on file  Transportation Needs:   . Lack of  Transportation (Medical): Not on file  . Lack of Transportation (Non-Medical): Not on file  Physical Activity:   . Days of Exercise per Week: Not on file  . Minutes of Exercise per Session: Not on file  Stress:   . Feeling of Stress : Not on file  Social Connections:   . Frequency of Communication with Friends and Family: Not on file  . Frequency of Social Gatherings with Friends and Family: Not on file  . Attends Religious Services: Not on file  . Active Member of Clubs or Organizations: Not on file  . Attends Archivist Meetings: Not on file  . Marital Status: Not on file    Allergies:  Allergies  Allergen Reactions  . Nintedanib Diarrhea and Nausea And Vomiting    Per pt report  . Penicillins Itching and Other (See Comments)    Yeast infection Pt reports no known drug allergies, but chart from [facility] shows Penicillins Reports hx of yeast infection during course of therapy. Yeast infection Yeast infection Pt reports no known drug allergies, but chart from [facility] shows Penicillins Pt reports no known drug allergies, but chart from [facility] shows Penicillins     Metabolic Disorder Labs: No results found for: HGBA1C, MPG No results found for: PROLACTIN No results found for: CHOL, TRIG, HDL, CHOLHDL, VLDL, LDLCALC No results found for: TSH  Therapeutic Level Labs: No results found for: LITHIUM No results found for: VALPROATE No components found for:  CBMZ  Current Medications: Current Outpatient Medications  Medication Sig Dispense Refill  . donepezil (ARICEPT) 5 MG tablet Take by mouth.    Marland Kitchen albuterol (ACCUNEB) 1.25 MG/3ML nebulizer solution     . albuterol (VENTOLIN HFA) 108 (90 Base) MCG/ACT inhaler Inhale into the lungs.    Marland Kitchen alendronate (FOSAMAX) 35 MG tablet Take by mouth.    Marland Kitchen amoxicillin (AMOXIL) 500 MG capsule Take 500 mg by mouth 3 (three) times daily.    Marland Kitchen ascorbic acid (VITAMIN C) 100 MG tablet Take by mouth.    Marland Kitchen aspirin 81 MG EC tablet Take by mouth.     Marland Kitchen azithromycin (ZITHROMAX) 500 MG tablet Take 500 mg by mouth daily.    Marland Kitchen b complex vitamins tablet Take by mouth.    . Blood Glucose Monitoring Suppl (FIFTY50 GLUCOSE METER 2.0) w/Device KIT Use as instructed    . Blood Glucose Monitoring Suppl (ONETOUCH VERIO) w/Device KIT Use to test blood sugar    . budesonide (PULMICORT) 0.5 MG/2ML nebulizer solution     . budesonide (PULMICORT) 0.5 MG/2ML nebulizer solution Inhale into the lungs.    Marland Kitchen buPROPion (WELLBUTRIN XL) 150 MG 24 hr tablet Take 2 tablets (300 mg total) by mouth daily with breakfast. 180 tablet 0  . Calcium Carbonate-Vitamin D 600-200 MG-UNIT TABS Take by mouth.    . Cholecalciferol 25 MCG (1000 UT) capsule Take by mouth.    . Cysteamine Bitartrate (PROCYSBI) 300 MG PACK Use 1 each once daily Use as instructed.  ACCU-CHEK AVIVA LANCETS WITH LANCING DEVICE E11.9    . Dulaglutide 1.5 MG/0.5ML SOPN Inject into the skin.    Marland Kitchen enalapril (VASOTEC) 2.5 MG tablet Take 2.5 mg by mouth daily.    . enalapril (VASOTEC) 2.5 MG tablet Take by mouth.    . ESBRIET 267 MG TABS     . famotidine (PEPCID) 40 MG tablet Take 40 mg by mouth daily.    . fluconazole (DIFLUCAN) 100 MG tablet Take by mouth.    Marland Kitchen FLUoxetine (PROZAC) 40 MG capsule Take 1 capsule (40 mg total) by mouth daily. 90 capsule 0  . fluticasone (FLONASE) 50 MCG/ACT nasal spray Place into the nose.    Marland Kitchen glucose blood (ONETOUCH VERIO) test strip 1 each (1 strip total) by XX route once daily Use as instructed.    Marland Kitchen glucose blood (PRECISION QID TEST) test strip by Other route 2 (two) times a day with meals. Please dispense 50 ultra fine lancets    . glucose blood (PRECISION QID TEST) test strip Use once daily Use as instructed.  ACCU-CHEK AVIVA PLUS TEST STRIPS E11.9    . guaifenesin (ROBITUSSIN) 100 MG/5ML syrup Take by mouth.    Marland Kitchen ibuprofen (ADVIL) 200 MG tablet Take by mouth.    . levothyroxine (SYNTHROID) 137 MCG tablet Take by mouth.    . loperamide (IMODIUM) 2 MG capsule Take by  mouth.    . lovastatin (MEVACOR) 40 MG tablet Take 40 mg by mouth daily.    . meclizine (ANTIVERT) 25 MG tablet Take by mouth.    . metFORMIN (GLUCOPHAGE) 500 MG tablet Take 500 mg by mouth 3 (three) times daily.    . metFORMIN (GLUCOPHAGE) 500 MG tablet TAKE 2 TABLETS BY MOUTH IN THE MORNING AND 1 TABLET IN THE EVENING    . Multiple Vitamin (MULTI-VITAMIN) tablet Take by mouth.    . mycophenolate (CELLCEPT) 500 MG tablet Take 1,000 mg by mouth 2 (two) times daily.    . mycophenolate (CELLCEPT) 500 MG tablet TAKE 2 TABLETS BY MOUTH 2 TIMES DAILY    . OFEV 150 MG CAPS     . omeprazole (PRILOSEC) 20 MG capsule Take by mouth.    Glory Rosebush Delica Lancets 19F MISC     . OneTouch Delica Lancets 79K MISC Use 1 each once daily Use as instructed.    Glory Rosebush ULTRA test strip     . ONETOUCH VERIO test strip 1 each daily.    . Pirfenidone (ESBRIET) 267 MG TABS Take by mouth.    . predniSONE (DELTASONE) 10 MG tablet     . risperiDONE (RISPERDAL) 0.5 MG tablet TAKE 1 TABLET (0.5 MG TOTAL) BY MOUTH DAILY. TAKE IN COMBINATION WITH 2 MG DAILY 90 tablet 0  . risperiDONE (RISPERDAL) 2 MG tablet Take 1 tablet (2 mg total) by mouth at bedtime. 90 tablet 0  . sodium chloride (OCEAN) 0.65 % nasal spray Place into the nose.    . sulfamethoxazole-trimethoprim (BACTRIM DS) 800-160 MG tablet TAKE 1 TABLET($RemoveBefor'160MG'YaovphSnYdif$  OF     TRIMETHOPRIM TOTAL) THREE  TIMES WEEKLY    . traMADol (ULTRAM) 50 MG tablet Take 50 mg by mouth every 6 (six) hours as needed.    . TRULICITY 1.5 WI/0.9BD SOPN SMARTSIG:0.5 Milliliter(s) SUB-Q Once a Week    . Vitamin D, Ergocalciferol, (DRISDOL) 1.25 MG (50000 UNIT) CAPS capsule      No current facility-administered medications for this visit.     Musculoskeletal: Strength & Muscle Tone: UTA Gait & Station:  UTA Patient leans: N/A  Psychiatric Specialty Exam: Review of Systems  Psychiatric/Behavioral: Positive for sleep disturbance.  All other systems reviewed and are negative.   There  were no vitals taken for this visit.There is no height or weight on file to calculate BMI.  General Appearance: UTA  Eye Contact:  UTA  Speech:  Clear and Coherent  Volume:  Normal  Mood:  Euthymic  Affect:  UTA  Thought Process:  Goal Directed and Descriptions of Associations: Intact  Orientation:  Full (Time, Place, and Person)  Thought Content: Logical   Suicidal Thoughts:  No  Homicidal Thoughts:  No  Memory:  Immediate;   Fair Recent;   Fair Remote;   Fair  Judgement:  Fair  Insight:  Fair  Psychomotor Activity:  UTA  Concentration:  Concentration: Fair and Attention Span: Fair  Recall:  [employer] of Knowledge: Fair  Language: Fair  Akathisia:  No  Handed:  Right  AIMS (if indicated): UTA  Assets:  Communication Skills Desire for Improvement Housing Social Support  ADL's:  Intact  Cognition: WNL  Sleep:  Sleep improving   Screenings: GAD-7     Counselor from 11/28/2019 in [facility]  Total GAD-7 Score 13    PHQ2-9     Counselor from 11/28/2019 in [facility] Video Visit from 09/19/2019 in [facility]  PHQ-2 Total Score 1 1  PHQ-9 Total Score 10 11       Assessment and Plan: Denise Knapp is a 64 year old Caucasian female, divorced, on SSD, lives in [place], has a history of MDD, hypothyroidism, interstitial lung disease, obstructive sleep apnea, morbid obesity was evaluated by telemedicine today.  Patient is biologically predisposed given her family history, multiple health issues, history of trauma.  Patient with psychosocial stressors of her own health issues, financial problems.  Patient however is currently making progress with regards to her mood, denies any perceptual disturbances.  Patient with memory changes and is currently under the care of neurology.  Plan as noted below.  Plan MDD-in remission Prozac 40 mg p.o. daily Wellbutrin XL 300 mg p.o. daily Risperidone 2.5  mg p.o. nightly Long-term plan is to taper her off of the risperidone.  However for now will continue.  Cognitive disorder unspecified-patient was referred to neurology. I have reviewed Dr. Lannie Fields notes from date 03/13/2020-patient was started on Aricept.  She will continue to follow-up with neurology.  Patient to continue CBT.  Follow-up in clinic in 2 months or sooner if needed.  I have spent atleast 20 minutes non face to face with patient today. More than 50 % of the time was spent for preparing to see the patient ( e.g., review of test, records ), ordering medications and test ,psychoeducation and supportive psychotherapy and care coordination,as well as documenting clinical information in electronic health record. This note was generated in part or whole with voice recognition software. Voice recognition is usually quite accurate but there are transcription errors that can and very often do occur. I apologize for any typographical errors that were not detected and corrected.         Ursula Alert, MD 03/15/2020, 11:13 AM

## 2020-03-16 DIAGNOSIS — R413 Other amnesia: Secondary | ICD-10-CM | POA: Insufficient documentation

## 2020-03-20 ENCOUNTER — Other Ambulatory Visit: Payer: Self-pay

## 2020-03-20 ENCOUNTER — Ambulatory Visit (INDEPENDENT_AMBULATORY_CARE_PROVIDER_SITE_OTHER): Payer: Medicare Other | Admitting: Licensed Clinical Social Worker

## 2020-03-20 DIAGNOSIS — F330 Major depressive disorder, recurrent, mild: Secondary | ICD-10-CM

## 2020-03-20 NOTE — Progress Notes (Signed)
Virtual Visit via Telephone Note  I connected with Denise Knapp on 03/20/20 at  2:30 PM EST by telephone and verified that I am speaking with the correct person using two identifiers.  Location: Patient: home Provider: [facility]   I discussed the limitations, risks, security and privacy concerns of performing an evaluation and management service by telephone and the availability of in person appointments. I also discussed with the patient that there may be a patient responsible charge related to this service. The patient expressed understanding and agreed to proceed.   The patient was advised to call back or seek an in-person evaluation if the symptoms worsen or if the condition fails to improve as anticipated.  I provided 30 minutes of non-face-to-face time during this encounter.    R , LCSW   THERAPIST PROGRESS NOTE  Session Time: 2:30-3p  Participation Level: Active  Behavioral Response: NAAlertgenerally pleasant and positive  Type of Therapy: Individual Therapy  Treatment Goals addressed: Anxiety and Coping  Interventions: Supportive  Summary: Denise Knapp is a 64 y.o. female who presents with symptoms consistent with depression diagnosis. Pt reports that symptoms are continuing to improve.  Pt reports that mood is stable and that she is getting good quality and quantity of sleep.    Allowed pt to explore and express thoughts and feelings about family relationships, and joy of upcoming holiday season. Pt is very excited to spend time with daughter from [place] coming to visit and visits with other daughter and grandchildren. Pt wants to visit mother--speaks with her mother daily on the phone.    Discussed importance of continuing self care, relaxation strategies, and mindfulness activities.   Suicidal/Homicidal: No  Therapist Response: Denise Knapp continues to make good progress with self understanding, self care, and improvements in family and relational functioning.    Plan: Return again in 3 weeks.  Diagnosis: Axis I: Major depressive disorder, mild    Axis II: No diagnosis    [name] , LCSW 03/20/2020

## 2020-04-03 ENCOUNTER — Other Ambulatory Visit: Payer: Self-pay | Admitting: Psychiatry

## 2020-04-03 DIAGNOSIS — F330 Major depressive disorder, recurrent, mild: Secondary | ICD-10-CM

## 2020-04-10 ENCOUNTER — Other Ambulatory Visit: Payer: Self-pay | Admitting: Psychiatry

## 2020-04-10 DIAGNOSIS — F330 Major depressive disorder, recurrent, mild: Secondary | ICD-10-CM

## 2020-04-11 ENCOUNTER — Other Ambulatory Visit: Payer: Self-pay | Admitting: Psychiatry

## 2020-04-11 DIAGNOSIS — F330 Major depressive disorder, recurrent, mild: Secondary | ICD-10-CM

## 2020-04-16 ENCOUNTER — Telehealth: Payer: Medicare Other | Admitting: Psychiatry

## 2020-04-18 ENCOUNTER — Other Ambulatory Visit: Payer: Self-pay

## 2020-04-18 ENCOUNTER — Ambulatory Visit (INDEPENDENT_AMBULATORY_CARE_PROVIDER_SITE_OTHER): Payer: Medicare PPO | Admitting: Licensed Clinical Social Worker

## 2020-04-18 DIAGNOSIS — F330 Major depressive disorder, recurrent, mild: Secondary | ICD-10-CM

## 2020-04-18 NOTE — Progress Notes (Signed)
Virtual Visit via Telephone Note  I connected with [name] on 04/18/20 at  3:00 PM EST by telephone and verified that I am speaking with the correct person using two identifiers.  Location: Patient: home Provider: remote office [place], [name]   I discussed the limitations, risks, security and privacy concerns of performing an evaluation and management service by telephone and the availability of in person appointments. I also discussed with the patient that there may be a patient responsible charge related to this service. The patient expressed understanding and agreed to proceed.  The patient was advised to call back or seek an in-person evaluation if the symptoms worsen or if the condition fails to improve as anticipated.  I provided 30 minutes of non-face-to-face time during this encounter.    R , LCSW   THERAPIST PROGRESS NOTE  Session Time: 3-3:30p  Participation Level: Active  Behavioral Response: NAAlertAnxious  Type of Therapy: Individual Therapy  Treatment Goals addressed: Anxiety and Coping  Interventions: Supportive  Summary: Denise Knapp is a 65 y.o. female who presents with improving symptoms related to depression diagnosis. Pt reports that mood is stable and that she is getting good quality and quantity of sleep.   Allowed pt to explore and express thoughts and feelings about family relationships, and pleasurable experiences over the holidays. Pt reports "I am happier now than I have been in my entire life".  Explored thoughts and feelings about how pt felt in the past when estranged from children--and compared the feeling to the stability of today.   Discussed health-related concerns and overall psychological impact. Encouraged pt to continue focusing on self care, life management, and positive social engagement.   Suicidal/Homicidal: No  Therapist Response: Kierstan is demonstrating a growing capacity for better family relationships,  parenting, and overall self care and life management. This is reflective of continuing progress.   Plan: Return again in 4 weeks.  Diagnosis: Axis I: Major Depression, Recurrent severe    Axis II: No diagnosis    [name] , LCSW 04/18/2020

## 2020-05-15 ENCOUNTER — Ambulatory Visit (INDEPENDENT_AMBULATORY_CARE_PROVIDER_SITE_OTHER): Payer: Medicare PPO | Admitting: Licensed Clinical Social Worker

## 2020-05-15 ENCOUNTER — Other Ambulatory Visit: Payer: Self-pay

## 2020-05-15 DIAGNOSIS — F3342 Major depressive disorder, recurrent, in full remission: Secondary | ICD-10-CM | POA: Diagnosis not present

## 2020-05-15 NOTE — Progress Notes (Signed)
Virtual Visit via Video Note  I connected with Denise Knapp on 05/15/20 at  2:00 PM EST by a video enabled telemedicine application and verified that I am speaking with the correct person using two identifiers.  Location: Patient: home Provider: [facility]   I discussed the limitations of evaluation and management by telemedicine and the availability of in person appointments. The patient expressed understanding and agreed to proceed.  The patient was advised to call back or seek an in-person evaluation if the symptoms worsen or if the condition fails to improve as anticipated.  I provided 35 minutes of non-face-to-face time during this encounter.    R , LCSW    THERAPIST PROGRESS NOTE  Session Time: 2-2:35p  Participation Level: Active  Behavioral Response: NAAlertEuthymic  Type of Therapy: Individual Therapy  Treatment Goals addressed:   Interventions: Supportive  Summary: Denise Knapp is a 65 y.o. female who presents with improving symptoms related to depression diagnosis. Pt reports that she is compliant with medication and that she is getting good quality and quantity of sleep. Pt reports that she has a good support system consisting mostly of immediate family members (sister, brother, daughters, mother).   Allowed pt to explore and express thoughts and feelings about overall progress--pt feels that she has really progressed well throughout the years and feels that she has achieved goals that she initially set for herself. Pt feels that she is trying hard to be a good sister, mother, and daughter to her family members. "I don't want them to think that when I call I am just being whiny all the time". Discussed communication skills and being a good listener when communicating with others.   Allowed pt to reflect on "good days and bad days" and to identify some coping skills that will help her manage through more difficult days. Pt feels that she has tools to help manage  through routine stressors. Pt feels that biggest stressors are health-related for herself. Encouraged pt to use congitively-stimulating activities to help break up the monotony of the day: dice games, dominos, cards, scrabble.   Encouraged pt to continue to manag physical healthcare conditions and use her coping skills to help manage stress.   Reinforced importance of self care, life balance, positive social interactions, and maintaining current levels of progress.  Suicidal/Homicidal: No  Therapist Response: Annalea is managing anxiety and able to identify at least 3 coping mechanisms to help mitigate stress, has developed healthy interpersonal relationships that lead to alleviation and help prevent the relapse of depression symptoms, and utilizing behavioral strategies to overcome depression symptoms. Pt feels that she has progressed well and met personal goals. Treatment to continue PRN.   Plan: Return again PRN  Diagnosis: Axis I: MDD, recurrent, in full remission    Axis II: No diagnosis    [place], LCSW 05/15/2020

## 2020-05-16 ENCOUNTER — Other Ambulatory Visit: Payer: Self-pay

## 2020-05-16 ENCOUNTER — Telehealth (INDEPENDENT_AMBULATORY_CARE_PROVIDER_SITE_OTHER): Payer: Medicare PPO | Admitting: Psychiatry

## 2020-05-16 ENCOUNTER — Encounter: Payer: Self-pay | Admitting: Psychiatry

## 2020-05-16 DIAGNOSIS — F09 Unspecified mental disorder due to known physiological condition: Secondary | ICD-10-CM

## 2020-05-16 DIAGNOSIS — F3342 Major depressive disorder, recurrent, in full remission: Secondary | ICD-10-CM | POA: Diagnosis not present

## 2020-05-16 NOTE — Progress Notes (Signed)
Virtual Visit via Video Note  I connected with Denise Knapp on 05/16/20 at 10:40 AM EST by a video enabled telemedicine application and verified that I am speaking with the correct person using two identifiers.  Location Provider Location : [facility] Patient Location : Home  Participants: Patient , Sister,Provider   I discussed the limitations of evaluation and management by telemedicine and the availability of in person appointments. The patient expressed understanding and agreed to proceed.   I discussed the assessment and treatment plan with the patient. The patient was provided an opportunity to ask questions and all were answered. The patient agreed with the plan and demonstrated an understanding of the instructions.   The patient was advised to call back or seek an in-person evaluation if the symptoms worsen or if the condition fails to improve as anticipated.   [facility] MD OP Progress Note  05/16/2020 1:47 PM Reighlyn Elmes  MRN:  [id number]  Chief Complaint:  Chief Complaint    Follow-up     Follow-up  HPI: Talayia Hjort is a 65 year old Caucasian female, retired, lives in [place], on [place], has a history of MDD, obstructive sleep apnea, hypothyroidism, interstitial lung disease, diabetes melitis, hyperlipidemia was evaluated by telemedicine today.  Patient today reports overall she is doing okay.  She however reports she feels sluggish and slowed down during the day.  She wonders if her medication has anything to do with it.  She is on risperidone 2.5 mg daily and reports she takes the whole dosage at bedtime.  Patient however denies any significant sadness, anxiety symptoms.  She reports she continues to worry about her children as well as her family however overall she is able to cope with it better than before.  She has been following up with her therapist.  She has good support system from her sister.  She also has been spending a lot of time with her mother.  Collateral information was  obtained from sister who believes patient is making progress.  Patient denies any suicidality, homicidality or perceptual disturbances.  Patient denies any other concerns today.  Visit Diagnosis:    ICD-10-CM   1. MDD (major depressive disorder), recurrent, in full remission ([place])  F33.42   2. Cognitive disorder  F09     Past Psychiatric History: I have reviewed past psychiatric history from my progress note on 09/19/2019  Past Medical History:  Past Medical History:  Diagnosis Date  . Asthma   . Diabetes mellitus without complication ([place])    History reviewed. No pertinent surgical history.  Family Psychiatric History: Reviewed family psychiatric history from my progress note on 09/19/2019  Family History:  Family History  Problem Relation Age of Onset  . Depression Mother   . Mental illness Daughter   . Mental illness Daughter     Social History: Reviewed social history from my progress note on 09/19/2019 Social History   Socioeconomic History  . Marital status: Legally Separated    Spouse name: Not on file  . Number of children: 2  . Years of education: Not on file  . Highest education level: Not on file  Occupational History  . Occupation: retired  Tobacco Use  . Smoking status: Never Smoker  . Smokeless tobacco: Never Used  Substance and Sexual Activity  . Alcohol use: Never  . Drug use: Never  . Sexual activity: Not Currently  Other Topics Concern  . 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 Difficulty of Paying Living Expenses: Somewhat hard  Food Insecurity: Not on file  Transportation Needs: Not on file  Physical Activity: Not on file  Stress: Not on file  Social Connections: Not on file    Allergies:  Allergies  Allergen Reactions  . Nintedanib Diarrhea and  Nausea And Vomiting    Per pt report  . Penicillins Itching and Other (See Comments)    Yeast infection Pt reports no known drug allergies, but chart from [facility] shows Penicillins Reports hx of yeast infection during course of therapy. Yeast infection Yeast infection Pt reports no known drug allergies, but chart from [facility] shows Penicillins Pt reports no known drug allergies, but chart from [facility] shows Penicillins     Metabolic Disorder Labs: No results found for: HGBA1C, MPG No results found for: PROLACTIN No results found for: CHOL, TRIG, HDL, CHOLHDL, VLDL, LDLCALC No results found for: TSH  Therapeutic Level Labs: No results found for: LITHIUM No results found for: VALPROATE No components found for:  CBMZ  Current Medications: Current Outpatient Medications  Medication Sig Dispense Refill  . albuterol (ACCUNEB) 1.25 MG/3ML nebulizer solution     . albuterol (VENTOLIN HFA) 108 (90 Base) MCG/ACT inhaler Inhale into the lungs.    Marland Kitchen alendronate (FOSAMAX) 35 MG tablet Take by mouth.    Marland Kitchen ascorbic acid (VITAMIN C) 100 MG tablet Take by mouth.    Marland Kitchen aspirin 81 MG EC tablet Take by mouth.    Marland Kitchen b complex vitamins tablet Take by mouth.    . Blood Glucose Monitoring Suppl (FIFTY50 GLUCOSE METER 2.0) w/Device KIT Use as instructed    . Blood Glucose Monitoring Suppl (ONETOUCH VERIO) w/Device KIT Use to test blood sugar    . budesonide (PULMICORT) 0.5 MG/2ML nebulizer solution     . budesonide (PULMICORT) 0.5 MG/2ML nebulizer solution Inhale into the lungs.    Marland Kitchen buPROPion (WELLBUTRIN XL) 150 MG 24 hr tablet TAKE 2 TABLETS (300 MG TOTAL) BY MOUTH DAILY WITH BREAKFAST. 180 tablet 0  . Calcium Carbonate-Vitamin D 600-200 MG-UNIT TABS Take by mouth.    . Cholecalciferol 25 MCG (1000 UT) capsule Take by mouth.    . Cysteamine Bitartrate (PROCYSBI) 300 MG PACK Use 1 each once daily Use as instructed.  ACCU-CHEK AVIVA LANCETS WITH LANCING DEVICE E11.9    . donepezil (ARICEPT) 5 MG tablet Take by  mouth.    . Dulaglutide 1.5 MG/0.5ML SOPN Inject into the skin.    Marland Kitchen enalapril (VASOTEC) 2.5 MG tablet Take 2.5 mg by mouth daily.    . enalapril (VASOTEC) 2.5 MG tablet Take by mouth.    . ESBRIET 267 MG TABS     . famotidine (PEPCID) 40 MG tablet Take 40 mg by mouth daily.    . fluconazole (DIFLUCAN) 100 MG tablet Take by mouth.    Marland Kitchen FLUoxetine (PROZAC) 40 MG capsule TAKE 1 CAPSULE BY MOUTH EVERY DAY 90 capsule 0  . fluticasone (FLONASE) 50 MCG/ACT nasal spray Place into the nose.    Marland Kitchen glucose blood (ONETOUCH VERIO) test strip 1 each (1 strip total) by XX route once daily Use as instructed.    Marland Kitchen glucose blood (PRECISION QID TEST) test strip by Other route 2 (two) times a day with meals. Please dispense 50 ultra fine lancets    . glucose  blood (PRECISION QID TEST) test strip Use once daily Use as instructed.  ACCU-CHEK AVIVA PLUS TEST STRIPS E11.9    . guaifenesin (ROBITUSSIN) 100 MG/5ML syrup Take by mouth.    Marland Kitchen ibuprofen (ADVIL) 200 MG tablet Take by mouth.    . levothyroxine (SYNTHROID) 137 MCG tablet Take by mouth.    . loperamide (IMODIUM) 2 MG capsule Take by mouth.    . lovastatin (MEVACOR) 40 MG tablet Take 40 mg by mouth daily.    . meclizine (ANTIVERT) 25 MG tablet Take by mouth.    . metFORMIN (GLUCOPHAGE) 500 MG tablet Take 500 mg by mouth 3 (three) times daily.    . metFORMIN (GLUCOPHAGE) 500 MG tablet TAKE 2 TABLETS BY MOUTH IN THE MORNING AND 1 TABLET IN THE EVENING    . Multiple Vitamin (MULTI-VITAMIN) tablet Take by mouth.    . mycophenolate (CELLCEPT) 500 MG tablet Take 1,000 mg by mouth 2 (two) times daily.    . mycophenolate (CELLCEPT) 500 MG tablet TAKE 2 TABLETS BY MOUTH 2 TIMES DAILY    . OFEV 150 MG CAPS     . omeprazole (PRILOSEC) 20 MG capsule Take by mouth.    Glory Rosebush Delica Lancets 30Q MISC     . OneTouch Delica Lancets 65H MISC Use 1 each once daily Use as instructed.    Glory Rosebush ULTRA test strip     . ONETOUCH VERIO test strip 1 each daily.    .  Pirfenidone (ESBRIET) 267 MG TABS Take by mouth.    . predniSONE (DELTASONE) 10 MG tablet     . risperiDONE (RISPERDAL) 2 MG tablet TAKE 1 TABLET BY MOUTH AT BEDTIME. 90 tablet 0  . sodium chloride (OCEAN) 0.65 % nasal spray Place into the nose.    . sulfamethoxazole-trimethoprim (BACTRIM DS) 800-160 MG tablet TAKE 1 TABLET(160MG OF     TRIMETHOPRIM TOTAL) THREE  TIMES WEEKLY    . traMADol (ULTRAM) 50 MG tablet Take 50 mg by mouth every 6 (six) hours as needed.    . TRULICITY 1.5 QI/6.9GE SOPN SMARTSIG:0.5 Milliliter(s) SUB-Q Once a Week    . Vitamin D, Ergocalciferol, (DRISDOL) 1.25 MG (50000 UNIT) CAPS capsule      No current facility-administered medications for this visit.     Musculoskeletal: Strength & Muscle Tone: UTA Gait & Station: UTA Patient leans: N/A  Psychiatric Specialty Exam: Review of Systems  Constitutional: Positive for fatigue.  Psychiatric/Behavioral: The patient is nervous/anxious.   All other systems reviewed and are negative.   There were no vitals taken for this visit.There is no height or weight on file to calculate BMI.  General Appearance: Casual  Eye Contact:  Fair  Speech:  Clear and Coherent  Volume:  Normal  Mood:  Anxious coping well  Affect:  Congruent  Thought Process:  Goal Directed and Descriptions of Associations: Intact  Orientation:  Full (Time, Place, and Person)  Thought Content: Logical   Suicidal Thoughts:  No  Homicidal Thoughts:  No  Memory:  Immediate;   Fair Recent;   Fair Remote;   Fair  Judgement:  Fair  Insight:  Fair  Psychomotor Activity:  Normal  Concentration:  Concentration: Fair and Attention Span: Fair  Recall:  [employer] of Knowledge: Fair  Language: Fair  Akathisia:  No  Handed:  Right  AIMS (if indicated): UTA  Assets:  Chief Executive Officer Social Support  ADL's:  Intact  Cognition: Impaired likely mild  Sleep:  Fair  Screenings: GAD-7   Flowsheet Row Counselor from 11/28/2019 in [facility]  Total GAD-7 Score 13    PHQ2-9   Flowsheet Row Counselor from 11/28/2019 in [facility] Video Visit from 09/19/2019 in [facility]  PHQ-2 Total Score 1 1  PHQ-9 Total Score 10 11       Assessment and Plan: Brandalynn Ofallon is a 65 year old Caucasian female, divorced, on SSD, lives in [place], has interstitial lung disease, obstructive sleep apnea, morbid obesity was evaluated by telemedicine today.  Patient is biologically predisposed given his family history, multiple health issues, history of trauma.  Patient with psychosocial stressors of her own health problems, financial problems.  Patient however is currently making progress although she is currently struggling with possible adverse side effects to her medication.  She does have good support system.  Plan as noted below.  Plan MDD in remission Prozac 40 mg p.o. daily Wellbutrin XL 300 mg p.o. daily Reduce risperidone to 2 mg p.o. nightly due to side effects.  Cognitive disorder unspecified-patient will continue to follow-up with neurology. Continue Aricept 5 mg p.o. daily.  Collateral information was obtained from sister.  Patient advised to continue CBT.  Follow-up in clinic in 2 weeks or sooner if needed.  I have spent atleast 20 minutes face to face by video with patient today. More than 50 % of the time was spent for preparing to see the patient ( e.g., review of test, records ), obtaining and to review and separately obtained history , ordering medications and test ,psychoeducation and supportive psychotherapy and care coordination,as well as documenting clinical information in electronic health record. This note was generated in part or whole with voice recognition software. Voice recognition is usually quite accurate but there are transcription errors that can and very often do occur. I apologize for any typographical errors that were not  detected and corrected.        Ursula Alert, MD 05/16/2020, 1:47 PM

## 2020-05-30 ENCOUNTER — Telehealth (INDEPENDENT_AMBULATORY_CARE_PROVIDER_SITE_OTHER): Payer: Medicare PPO | Admitting: Psychiatry

## 2020-05-30 ENCOUNTER — Encounter: Payer: Self-pay | Admitting: Psychiatry

## 2020-05-30 ENCOUNTER — Other Ambulatory Visit: Payer: Self-pay

## 2020-05-30 DIAGNOSIS — F3342 Major depressive disorder, recurrent, in full remission: Secondary | ICD-10-CM | POA: Diagnosis not present

## 2020-05-30 DIAGNOSIS — F09 Unspecified mental disorder due to known physiological condition: Secondary | ICD-10-CM

## 2020-05-30 NOTE — Progress Notes (Signed)
Virtual Visit via Telephone Note  I connected with Denise Knapp on 05/30/20 at  9:00 AM EST by telephone and verified that I am speaking with the correct person using two identifiers.  Location Provider Location : [facility] Patient Location : Home  Participants: Patient , Provider    I discussed the limitations, risks, security and privacy concerns of performing an evaluation and management service by telephone and the availability of in person appointments. I also discussed with the patient that there may be a patient responsible charge related to this service. The patient expressed understanding and agreed to proceed.  I discussed the assessment and treatment plan with the patient. The patient was provided an opportunity to ask questions and all were answered. The patient agreed with the plan and demonstrated an understanding of the instructions.   The patient was advised to call back or seek an in-person evaluation if the symptoms worsen or if the condition fails to improve as anticipated.   [facility] MD OP Progress Note  05/30/2020 9:53 AM Denise Knapp  MRN:  [id number]  Chief Complaint:  Chief Complaint    Follow-up     HPI: Denise Knapp is a 65 year old Caucasian female, retired, lives in [place], has a history of MDD, obstructive sleep apnea, hypothyroidism, interstitial lung disease, diabetes melitis, hyperlipidemia was evaluated by telemedicine today.  Patient today reports she is currently struggling with flulike symptoms, sore throat, headache as well as diarrhea.  She reports however the symptoms are getting better day by day.  She does have myalgia and fatigue which is also getting better.  Patient reports since reducing the dosage of risperidone she has felt better with regards to her sluggishness during the day.  She denies any significant depression at this time.  She is anxious about her health however she is coping well.  Patient denies any suicidality, homicidality or  perceptual disturbances.  She reports sleep as good.  Since she has flulike symptoms she has had reduced appetite the past few days and possibly some weight loss .  Patient however reports it is getting better.  She is currently in psychotherapy sessions and reports therapy sessions as beneficial.  Patient denies any other concerns today.  Visit Diagnosis:    ICD-10-CM   1. MDD (major depressive disorder), recurrent, in full remission ([facility])  F33.42   2. Cognitive disorder  F09     Past Psychiatric History: I have reviewed past psychiatric history from my progress note on 09/19/2019.  Past Medical History:  Past Medical History:  Diagnosis Date  . Asthma   . Diabetes mellitus without complication ([place])    History reviewed. No pertinent surgical history.  Family Psychiatric History: I have reviewed family psychiatric history from my progress note on 09/19/2019  Family History:  Family History  Problem Relation Age of Onset  . Depression Mother   . Mental illness Daughter   . Mental illness Daughter     Social History: Reviewed social history from my progress note on 09/19/2019 Social History   Socioeconomic History  . Marital status: Legally Separated    Spouse name: Not on file  . Number of children: 2  . Years of education: Not on file  . Highest education level: Not on file  Occupational History  . Occupation: retired  Tobacco Use  . Smoking status: Never Smoker  . Smokeless tobacco: Never Used  Substance and Sexual Activity  . Alcohol use: Never  . Drug use: Never  . Sexual activity:  Not Currently  Other Topics Concern  . 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 Difficulty of Paying Living Expenses: Somewhat hard  Food Insecurity: Not on file  Transportation Needs: Not  on file  Physical Activity: Not on file  Stress: Not on file  Social Connections: Not on file    Allergies:  Allergies  Allergen Reactions  . Nintedanib Diarrhea and Nausea And Vomiting    Per pt report  . Penicillins Itching and Other (See Comments)    Yeast infection Pt reports no known drug allergies, but chart from [facility] shows Penicillins Reports hx of yeast infection during course of therapy. Yeast infection Yeast infection Pt reports no known drug allergies, but chart from [facility] shows Penicillins Pt reports no known drug allergies, but chart from [facility] shows Penicillins     Metabolic Disorder Labs: No results found for: HGBA1C, MPG No results found for: PROLACTIN No results found for: CHOL, TRIG, HDL, CHOLHDL, VLDL, LDLCALC No results found for: TSH  Therapeutic Level Labs: No results found for: LITHIUM No results found for: VALPROATE No components found for:  CBMZ  Current Medications: Current Outpatient Medications  Medication Sig Dispense Refill  . albuterol (ACCUNEB) 1.25 MG/3ML nebulizer solution     . albuterol (VENTOLIN HFA) 108 (90 Base) MCG/ACT inhaler Inhale into the lungs.    Marland Kitchen alendronate (FOSAMAX) 35 MG tablet Take by mouth.    Marland Kitchen ascorbic acid (VITAMIN C) 100 MG tablet Take by mouth.    Marland Kitchen aspirin 81 MG EC tablet Take by mouth.    Marland Kitchen b complex vitamins tablet Take by mouth.    . Blood Glucose Monitoring Suppl (FIFTY50 GLUCOSE METER 2.0) w/Device KIT Use as instructed    . Blood Glucose Monitoring Suppl (ONETOUCH VERIO) w/Device KIT Use to test blood sugar    . budesonide (PULMICORT) 0.5 MG/2ML nebulizer solution     . budesonide (PULMICORT) 0.5 MG/2ML nebulizer solution Inhale into the lungs.    Marland Kitchen buPROPion (WELLBUTRIN XL) 150 MG 24 hr tablet TAKE 2 TABLETS (300 MG TOTAL) BY MOUTH DAILY WITH BREAKFAST. 180 tablet 0  . Calcium Carbonate-Vitamin D 600-200 MG-UNIT TABS Take by mouth.    . Cholecalciferol 25 MCG (1000 UT) capsule Take by mouth.    .  donepezil (ARICEPT) 5 MG tablet Take by mouth.    . Dulaglutide 1.5 MG/0.5ML SOPN Inject into the skin.    Marland Kitchen enalapril (VASOTEC) 2.5 MG tablet Take 2.5 mg by mouth daily.    . enalapril (VASOTEC) 2.5 MG tablet Take by mouth.    . ESBRIET 267 MG TABS     . famotidine (PEPCID) 40 MG tablet Take 40 mg by mouth daily.    . fluconazole (DIFLUCAN) 100 MG tablet Take by mouth.    Marland Kitchen FLUoxetine (PROZAC) 40 MG capsule TAKE 1 CAPSULE BY MOUTH EVERY DAY 90 capsule 0  . fluticasone (FLONASE) 50 MCG/ACT nasal spray Place into the nose.    Marland Kitchen glucose blood (ONETOUCH VERIO) test strip 1 each (1 strip total) by XX route once daily Use as instructed.    Marland Kitchen glucose blood (PRECISION QID TEST) test strip by Other route 2 (two) times a day with meals. Please dispense 50 ultra fine lancets    . glucose blood (PRECISION  QID TEST) test strip Use once daily Use as instructed.  ACCU-CHEK AVIVA PLUS TEST STRIPS E11.9    . guaifenesin (ROBITUSSIN) 100 MG/5ML syrup Take by mouth.    Marland Kitchen ibuprofen (ADVIL) 200 MG tablet Take by mouth.    . levothyroxine (SYNTHROID) 137 MCG tablet Take by mouth.    . loperamide (IMODIUM) 2 MG capsule Take by mouth.    . lovastatin (MEVACOR) 40 MG tablet Take 40 mg by mouth daily.    . meclizine (ANTIVERT) 25 MG tablet Take by mouth.    . metFORMIN (GLUCOPHAGE) 500 MG tablet Take 500 mg by mouth 3 (three) times daily.    . metFORMIN (GLUCOPHAGE) 500 MG tablet TAKE 2 TABLETS BY MOUTH IN THE MORNING AND 1 TABLET IN THE EVENING    . Multiple Vitamin (MULTI-VITAMIN) tablet Take by mouth.    . mycophenolate (CELLCEPT) 500 MG tablet Take 1,000 mg by mouth 2 (two) times daily.    . mycophenolate (CELLCEPT) 500 MG tablet TAKE 2 TABLETS BY MOUTH 2 TIMES DAILY    . OFEV 150 MG CAPS     . omeprazole (PRILOSEC) 20 MG capsule Take by mouth.    Glory Rosebush Delica Lancets 54M MISC     . OneTouch Delica Lancets 08Q MISC Use 1 each once daily Use as instructed.    Glory Rosebush ULTRA test strip     . ONETOUCH  VERIO test strip 1 each daily.    . Pirfenidone (ESBRIET) 267 MG TABS Take by mouth.    . predniSONE (DELTASONE) 10 MG tablet     . risperiDONE (RISPERDAL) 2 MG tablet TAKE 1 TABLET BY MOUTH AT BEDTIME. 90 tablet 0  . sodium chloride (OCEAN) 0.65 % nasal spray Place into the nose.    . sulfamethoxazole-trimethoprim (BACTRIM DS) 800-160 MG tablet TAKE 1 TABLET(160MG OF     TRIMETHOPRIM TOTAL) THREE  TIMES WEEKLY    . traMADol (ULTRAM) 50 MG tablet Take 50 mg by mouth every 6 (six) hours as needed.    . TRULICITY 1.5 PY/1.9JK SOPN SMARTSIG:0.5 Milliliter(s) SUB-Q Once a Week    . Vitamin D, Ergocalciferol, (DRISDOL) 1.25 MG (50000 UNIT) CAPS capsule      No current facility-administered medications for this visit.     Musculoskeletal: Strength & Muscle Tone: UTA Gait & Station: UTA Patient leans: N/A  Psychiatric Specialty Exam: Review of Systems  Constitutional: Positive for appetite change and fatigue.  Gastrointestinal: Positive for diarrhea (improving).  Musculoskeletal: Positive for myalgias.  Psychiatric/Behavioral: Negative for agitation, behavioral problems, confusion, decreased concentration, dysphoric mood, hallucinations, self-injury, sleep disturbance and suicidal ideas. The patient is nervous/anxious. The patient is not hyperactive.   All other systems reviewed and are negative.   There were no vitals taken for this visit.There is no height or weight on file to calculate BMI.  General Appearance: UTA  Eye Contact:  UTA  Speech:  Normal Rate  Volume:  Normal  Mood:  Anxious improving  Affect:  UTA  Thought Process:  Goal Directed and Descriptions of Associations: Intact  Orientation:  Full (Time, Place, and Person)  Thought Content: Logical   Suicidal Thoughts:  No  Homicidal Thoughts:  No  Memory:  Immediate;   Fair Recent;   Fair Remote;   Limited reports short term memory loss  Judgement:  Fair  Insight:  Fair  Psychomotor Activity:  UTA  Concentration:   Concentration: Fair and Attention Span: Fair  Recall:  [employer] of Knowledge: Fair  Language: Fair  Akathisia:  No  Handed:  Right  AIMS (if indicated): UTA  Assets:  Communication Skills Housing Social Support  ADL's:  Intact  Cognition: WNL  Sleep:  Fair   Screenings: GAD-7   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/28/2019 in [facility]  Total GAD-7 Score 13    PHQ2-9   Flowsheet Row Video Visit from 05/30/2020 in [facility] Counselor from 11/28/2019 in [facility] Video Visit from 09/19/2019 in [facility]  PHQ-2 Total Score 1 1 1   PHQ-9 Total Score - 10 11    Flowsheet Row Video Visit from 05/30/2020 in [facility] No Risk       Assessment and Plan: Terry Abila is a 65 year old Caucasian female, divorced, on SSD, lives in [place], has interstitial lung disease, obstructive sleep apnea, morbid obesity was evaluated by telemedicine today.  Patient is biologically predisposed given his family history, multiple health issues, history of trauma.  Patient with psychosocial stressors of health problems, financial problems,.  Patient is currently making progress.  Plan as noted below.  Plan MDD in remission Prozac 40 mg p.o. daily Wellbutrin XL 300 mg p.o. daily Risperidone at reduced dosage of 2 mg p.o. nightly due to side effects. PHQ 9 today equals 1  Cognitive disorder unspecified-patient continues to be on Aricept and will continue to follow-up with her neurologist as needed.  Patient advised to follow-up with her primary care provider for her flulike symptoms, GI problems.  Patient to continue psychotherapy sessions with Ms. Christina Hussami   Follow-up in clinic in 4 weeks or sooner if needed.  I have spent atleast 20 minutes non face to face  with patient today. More than 50 % of the time was spent for  preparing to see the patient ( e.g., review of test, records ),  ordering medications and test ,psychoeducation and supportive psychotherapy and care coordination,as well as documenting clinical information in electronic health record. This note was generated in part or whole with voice recognition software. Voice recognition is usually quite accurate but there are transcription errors that can and very often do occur. I apologize for any typographical errors that were not detected and corrected.      Ursula Alert, MD 05/30/2020, 9:53 AM

## 2020-07-05 ENCOUNTER — Other Ambulatory Visit: Payer: Self-pay

## 2020-07-05 ENCOUNTER — Encounter: Payer: Self-pay | Admitting: Psychiatry

## 2020-07-05 ENCOUNTER — Telehealth (INDEPENDENT_AMBULATORY_CARE_PROVIDER_SITE_OTHER): Payer: Medicare PPO | Admitting: Psychiatry

## 2020-07-05 DIAGNOSIS — F330 Major depressive disorder, recurrent, mild: Secondary | ICD-10-CM

## 2020-07-05 DIAGNOSIS — F3342 Major depressive disorder, recurrent, in full remission: Secondary | ICD-10-CM

## 2020-07-05 DIAGNOSIS — F09 Unspecified mental disorder due to known physiological condition: Secondary | ICD-10-CM

## 2020-07-05 MED ORDER — RISPERIDONE 2 MG PO TABS: 2.0000 mg | ORAL_TABLET | Freq: Every day | ORAL | 0 refills | Status: DC

## 2020-07-05 MED ORDER — FLUOXETINE HCL 40 MG PO CAPS: ORAL_CAPSULE | ORAL | 0 refills | Status: DC

## 2020-07-05 MED ORDER — BUPROPION HCL ER (XL) 150 MG PO TB24: 300.0000 mg | ORAL_TABLET | Freq: Every day | ORAL | 0 refills | Status: DC

## 2020-07-05 NOTE — Progress Notes (Signed)
Virtual Visit via Telephone Note  I connected with Denise Knapp on 07/05/20 at 10:00 AM EDT by telephone and verified that I am speaking with the correct person using two identifiers.  Location Provider Location : [facility] Patient Location : Home  Participants: Patient , Provider   I discussed the limitations, risks, security and privacy concerns of performing an evaluation and management service by telephone and the availability of in person appointments. I also discussed with the patient that there may be a patient responsible charge related to this service. The patient expressed understanding and agreed to proceed.   I discussed the assessment and treatment plan with the patient. The patient was provided an opportunity to ask questions and all were answered. The patient agreed with the plan and demonstrated an understanding of the instructions.   The patient was advised to call back or seek an in-person evaluation if the symptoms worsen or if the condition fails to improve as anticipated.   [facility] MD OP Progress Note  07/05/2020 10:16 AM Denise Knapp  MRN:  [id number]  Chief Complaint:  Chief Complaint    Follow-up; Depression     HPI: Denise Knapp is a 65 year old Caucasian female, retired, lives in [place], on [place], has a history of MDD, obstructive sleep apnea, hypothyroidism, interstitial lung disease, diabetes melitis, hyperlipidemia was evaluated by telemedicine today.  Patient today reports she is currently doing well.  She reports her grandchildren are sick and hence she is planning to visit them.  Patient continues to be compliant on medications.  Denies side effects.  She reports sleep and appetite is fair.  Patient continues to have good support system from her family especially her sister.  She denies any suicidality, homicidality or perceptual disturbances.  Patient denies any other concerns today.  Visit Diagnosis:    ICD-10-CM   1. MDD (major depressive disorder),  recurrent, in full remission ([place])  F33.42 risperiDONE (RISPERDAL) 2 MG tablet    buPROPion (WELLBUTRIN XL) 150 MG 24 hr tablet    FLUoxetine (PROZAC) 40 MG capsule  2. Cognitive disorder  F09     Past Psychiatric History: I have reviewed past psychiatric history from my progress note on 09/19/2019  Past Medical History:  Past Medical History:  Diagnosis Date  . Asthma   . Diabetes mellitus without complication ([place])    History reviewed. No pertinent surgical history.  Family Psychiatric History: Reviewed family psychiatric history from my progress note on 09/19/2019  Family History:  Family History  Problem Relation Age of Onset  . Depression Mother   . Mental illness Daughter   . Mental illness Daughter     Social History: Reviewed social history from my progress note on 09/19/2019 Social History   Socioeconomic History  . Marital status: Legally Separated    Spouse name: Not on file  . Number of children: 2  . Years of education: Not on file  . Highest education level: Not on file  Occupational History  . Occupation: retired  Tobacco Use  . Smoking status: Never Smoker  . Smokeless tobacco: Never Used  Substance and Sexual Activity  . Alcohol use: Never  . Drug use: Never  . Sexual activity: Not Currently  Other Topics Concern  . 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  Difficulty of Paying Living Expenses: Somewhat hard  Food Insecurity: Not on file  Transportation Needs: Not on file  Physical Activity: Not on file  Stress: Not on file  Social Connections: Not on file    Allergies:  Allergies  Allergen Reactions  . Nintedanib Diarrhea and Nausea And Vomiting    Per pt report  . Penicillins Itching and Other (See Comments)    Yeast infection Pt reports no known  drug allergies, but chart from [facility] shows Penicillins Reports hx of yeast infection during course of therapy. Yeast infection Yeast infection Pt reports no known drug allergies, but chart from [facility] shows Penicillins Pt reports no known drug allergies, but chart from [facility] shows Penicillins     Metabolic Disorder Labs: No results found for: HGBA1C, MPG No results found for: PROLACTIN No results found for: CHOL, TRIG, HDL, CHOLHDL, VLDL, LDLCALC No results found for: TSH  Therapeutic Level Labs: No results found for: LITHIUM No results found for: VALPROATE No components found for:  CBMZ  Current Medications: Current Outpatient Medications  Medication Sig Dispense Refill  . albuterol (ACCUNEB) 1.25 MG/3ML nebulizer solution     . albuterol (VENTOLIN HFA) 108 (90 Base) MCG/ACT inhaler Inhale into the lungs.    Marland Kitchen alendronate (FOSAMAX) 35 MG tablet Take by mouth.    Marland Kitchen ascorbic acid (VITAMIN C) 100 MG tablet Take by mouth.    Marland Kitchen aspirin 81 MG EC tablet Take by mouth.    Marland Kitchen b complex vitamins tablet Take by mouth.    . Blood Glucose Monitoring Suppl (FIFTY50 GLUCOSE METER 2.0) w/Device KIT Use as instructed    . Blood Glucose Monitoring Suppl (ONETOUCH VERIO) w/Device KIT Use to test blood sugar    . budesonide (PULMICORT) 0.5 MG/2ML nebulizer solution     . budesonide (PULMICORT) 0.5 MG/2ML nebulizer solution Inhale into the lungs.    Marland Kitchen buPROPion (WELLBUTRIN XL) 150 MG 24 hr tablet Take 2 tablets (300 mg total) by mouth daily with breakfast. 180 tablet 0  . Calcium Carbonate-Vitamin D 600-200 MG-UNIT TABS Take by mouth.    . Cholecalciferol 25 MCG (1000 UT) capsule Take by mouth.    . donepezil (ARICEPT) 5 MG tablet Take by mouth.    . Dulaglutide 1.5 MG/0.5ML SOPN Inject into the skin.    Marland Kitchen enalapril (VASOTEC) 2.5 MG tablet Take 2.5 mg by mouth daily.    . enalapril (VASOTEC) 2.5 MG tablet Take by mouth.    . ESBRIET 267 MG TABS     . famotidine (PEPCID) 40 MG tablet Take 40 mg by  mouth daily.    . fluconazole (DIFLUCAN) 100 MG tablet Take by mouth.    Marland Kitchen FLUoxetine (PROZAC) 40 MG capsule TAKE 1 CAPSULE BY MOUTH EVERY DAY 90 capsule 0  . fluticasone (FLONASE) 50 MCG/ACT nasal spray Place into the nose.    Marland Kitchen glucose blood (PRECISION QID TEST) test strip by Other route 2 (two) times a day with meals. Please dispense 50 ultra fine lancets    . glucose blood (PRECISION QID TEST) test strip Use once daily Use as instructed.  ACCU-CHEK AVIVA PLUS TEST STRIPS E11.9    . guaifenesin (ROBITUSSIN) 100 MG/5ML syrup Take by mouth.    Marland Kitchen ibuprofen (ADVIL) 200 MG tablet Take by mouth.    . levothyroxine (SYNTHROID) 137 MCG tablet Take by mouth.    . loperamide (IMODIUM) 2 MG capsule Take by mouth.    . lovastatin (MEVACOR) 40 MG tablet Take 40 mg by  mouth daily.    . meclizine (ANTIVERT) 25 MG tablet Take by mouth.    . metFORMIN (GLUCOPHAGE) 500 MG tablet Take 500 mg by mouth 3 (three) times daily.    . metFORMIN (GLUCOPHAGE) 500 MG tablet TAKE 2 TABLETS BY MOUTH IN THE MORNING AND 1 TABLET IN THE EVENING    . Multiple Vitamin (MULTI-VITAMIN) tablet Take by mouth.    . mycophenolate (CELLCEPT) 500 MG tablet Take 1,000 mg by mouth 2 (two) times daily.    . mycophenolate (CELLCEPT) 500 MG tablet TAKE 2 TABLETS BY MOUTH 2 TIMES DAILY    . OFEV 150 MG CAPS     . omeprazole (PRILOSEC) 20 MG capsule Take by mouth.    Glory Rosebush Delica Lancets 59R MISC     . ONETOUCH ULTRA test strip     . ONETOUCH VERIO test strip 1 each daily.    . Pirfenidone (ESBRIET) 267 MG TABS Take by mouth.    . predniSONE (DELTASONE) 10 MG tablet     . risperiDONE (RISPERDAL) 2 MG tablet Take 1 tablet (2 mg total) by mouth at bedtime. 90 tablet 0  . sodium chloride (OCEAN) 0.65 % nasal spray Place into the nose.    . sulfamethoxazole-trimethoprim (BACTRIM DS) 800-160 MG tablet TAKE 1 TABLET(160MG OF     TRIMETHOPRIM TOTAL) THREE  TIMES WEEKLY    . traMADol (ULTRAM) 50 MG tablet Take 50 mg by mouth every 6 (six)  hours as needed.    . TRULICITY 1.5 CB/6.3AG SOPN SMARTSIG:0.5 Milliliter(s) SUB-Q Once a Week    . Vitamin D, Ergocalciferol, (DRISDOL) 1.25 MG (50000 UNIT) CAPS capsule      No current facility-administered medications for this visit.     Musculoskeletal: Strength & Muscle Tone: UTA Gait & Station: UTA Patient leans: N/A  Psychiatric Specialty Exam: Review of Systems  Psychiatric/Behavioral: Negative for agitation, behavioral problems, confusion, decreased concentration, dysphoric mood, hallucinations, self-injury, sleep disturbance and suicidal ideas. The patient is not nervous/anxious and is not hyperactive.   All other systems reviewed and are negative.   There were no vitals taken for this visit.There is no height or weight on file to calculate BMI.  General Appearance: UTA  Eye Contact:  UTA  Speech:  Clear and Coherent  Volume:  Normal  Mood:  Euthymic  Affect:  UTA  Thought Process:  Goal Directed and Descriptions of Associations: Intact  Orientation:  Full (Time, Place, and Person)  Thought Content: Logical   Suicidal Thoughts:  No  Homicidal Thoughts:  No  Memory:  Immediate;   Fair Recent;   Fair Remote;   Limited  Judgement:  Fair  Insight:  Fair  Psychomotor Activity:  UTA  Concentration:  Concentration: Fair and Attention Span: Fair  Recall:  [employer] of Knowledge: Fair  Language: Fair  Akathisia:  No  Handed:  Right  AIMS (if indicated): UTA  Assets:  Communication Skills Desire for Improvement Housing Social Support  ADL's:  Intact  Cognition: WNL  Sleep:  Fair   Screenings: GAD-7   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/28/2019 in [facility]  Total GAD-7 Score 13    PHQ2-9   [place] Video Visit from 05/30/2020 in [facility] Counselor from 11/28/2019 in [facility] Video Visit from 09/19/2019 in [facility]  PHQ-2 Total Score 1 1  1   PHQ-9 Total Score -- 10 11    Flowsheet Row Video [facility]/30/2020 in  [facility]  C-SSRS RISK CATEGORY No Risk       Assessment and Plan: Nayomi Tabron is a 65 year old Caucasian female, divorced, on SSD, lives in [place], has interstitial lung disease, obstructive sleep apnea, morbid obesity was evaluated by telemedicine today.  Patient is biologically predisposed given  family history, multiple health issues, history of trauma.  Patient with psychosocial stressors of health problems, financial problems.  Patient is currently making progress.  Plan as noted below.  Plan MDD in remission Prozac 40 mg p.o. daily Wellbutrin XL 300 mg p.o. daily Risperidone 2 mg p.o. nightly at reduced dosage.  Cognitive disorder unspecified-patient to continue Aricept.  Continue neurology follow-ups.  Follow-up in clinic again 1 month or sooner if needed.   I have spent at least 10 minutes non face to face with patient today which includes the time spent for preparing to see the patient  (e.g., review of test, records), ordering medications and test, psychoeducation and supportive psychotherapy, care coordination as well as documenting clinical information in electronic health record.       Ursula Alert, MD 07/06/2020, 8:20 AM

## 2020-07-11 ENCOUNTER — Ambulatory Visit (INDEPENDENT_AMBULATORY_CARE_PROVIDER_SITE_OTHER): Payer: Medicare PPO | Admitting: Licensed Clinical Social Worker

## 2020-07-11 ENCOUNTER — Other Ambulatory Visit: Payer: Self-pay

## 2020-07-11 DIAGNOSIS — F3342 Major depressive disorder, recurrent, in full remission: Secondary | ICD-10-CM | POA: Diagnosis not present

## 2020-07-11 NOTE — Progress Notes (Signed)
Virtual Visit via Audio Note  I connected with Denise Knapp on 07/11/20 at  9:00 AM EDT by an audio enabled telemedicine application and verified that I am speaking with the correct person using two identifiers.  Location: Patient: home Provider: [facility]   I discussed the limitations of evaluation and management by telemedicine and the availability of in person appointments. The patient expressed understanding and agreed to proceed.   I discussed the assessment and treatment plan with the patient. The patient was provided an opportunity to ask questions and all were answered. The patient agreed with the plan and demonstrated an understanding of the instructions.   The patient was advised to call back or seek an in-person evaluation if the symptoms worsen or if the condition fails to improve as anticipated.  I provided 30 minutes of non-face-to-face time during this encounter.    R , LCSW    THERAPIST PROGRESS NOTE  Session Time: 9:10-9:40a  Participation Level: Active  Behavioral Response: NAAlertAnxious  Type of Therapy: Individual Therapy  Treatment Goals addressed: Coping  Interventions: Supportive  Summary: Denise Knapp is a 65 y.o. female who presents with improving symptoms related to MDD. Pt reports that overall mood is stable. Pt reports that she is managing external stressors and anxiety well. Pt reports good quality and quantity of sleep.  Allowed pt to explore current life events and external stressors. Pt reports that she has some anxiety over the cost of living going up and the current state of the [name] war, but is trying to limit the time that she is spending watching on TV.  Pt reports positive relationships with two daughters and with her mother.   Suicidal/Homicidal: No  Therapist Response: Eleonora   Plan: Return again in 8 weeks.  Diagnosis: Axis I: Major Depression, Recurrent, in full remission    Axis II: No  diagnosis    [name] , LCSW 07/11/2020

## 2020-07-12 ENCOUNTER — Other Ambulatory Visit: Payer: Self-pay | Admitting: Psychiatry

## 2020-07-12 DIAGNOSIS — F330 Major depressive disorder, recurrent, mild: Secondary | ICD-10-CM

## 2020-07-25 ENCOUNTER — Other Ambulatory Visit: Payer: Self-pay | Admitting: Psychiatry

## 2020-07-25 DIAGNOSIS — F3342 Major depressive disorder, recurrent, in full remission: Secondary | ICD-10-CM

## 2020-07-26 ENCOUNTER — Other Ambulatory Visit: Payer: Self-pay | Admitting: Psychiatry

## 2020-07-26 ENCOUNTER — Telehealth: Payer: Self-pay

## 2020-07-26 DIAGNOSIS — F3342 Major depressive disorder, recurrent, in full remission: Secondary | ICD-10-CM

## 2020-07-26 MED ORDER — BUPROPION HCL ER (XL) 150 MG PO TB24: 300.0000 mg | ORAL_TABLET | Freq: Every day | ORAL | 0 refills | Status: DC

## 2020-07-26 NOTE — Telephone Encounter (Signed)
pt called states she can not find her bupropion she states she has looked everywhere for it and she doesnt know what happen to it.

## 2020-07-27 ENCOUNTER — Other Ambulatory Visit: Payer: Self-pay | Admitting: Child and Adolescent Psychiatry

## 2020-07-27 DIAGNOSIS — F3342 Major depressive disorder, recurrent, in full remission: Secondary | ICD-10-CM

## 2020-08-14 ENCOUNTER — Ambulatory Visit: Payer: Medicare PPO | Admitting: Psychiatry

## 2020-09-18 ENCOUNTER — Other Ambulatory Visit: Payer: Self-pay

## 2020-09-18 ENCOUNTER — Ambulatory Visit (INDEPENDENT_AMBULATORY_CARE_PROVIDER_SITE_OTHER): Payer: Medicare PPO | Admitting: Licensed Clinical Social Worker

## 2020-09-18 DIAGNOSIS — F3342 Major depressive disorder, recurrent, in full remission: Secondary | ICD-10-CM

## 2020-09-18 NOTE — Progress Notes (Signed)
Virtual Visit via Video Note  I connected with [name] on 09/18/20 at 10:00 AM EDT by a video enabled telemedicine application and verified that I am speaking with the correct person using two identifiers.  Location: Patient: home Provider: remote office [place], [name]   I discussed the limitations of evaluation and management by telemedicine and the availability of in person appointments. The patient expressed understanding and agreed to proceed.  I discussed the assessment and treatment plan with the patient. The patient was provided an opportunity to ask questions and all were answered. The patient agreed with the plan and demonstrated an understanding of the instructions.   The patient was advised to call back or seek an in-person evaluation if the symptoms worsen or if the condition fails to improve as anticipated.  I provided 30 minutes of non-face-to-face time during this encounter.    R , LCSW    THERAPIST PROGRESS NOTE  Session Time: 10-10:30a  Participation Level: Active  Behavioral Response: Neat and Well GroomedAlertEuthymic  Type of Therapy: Individual Therapy  Treatment Goals addressed: Coping  Interventions: Supportive  Summary: Denise Knapp is a 65 y.o. female who presents with improving symptoms related to depression diagnosis. Pt reports that overall mood is stable and that she is managing situational stress/anxiety well. Pt is utilizing coping skills when needed.   Allowed pt to explore and express thoughts and feelings associated with recent life situations and external stressors. Denise Knapp reports that she is in a very good place in life right now--she is enjoying spending time with the daughter that lives here and enjoys spending time with her daughter and grandchildren on [place] in [place]. Pt reports that she is in a really good place right now and her goals are to maintain current levels of progress.  Reviewed coping skills that have  worked for pt in the past--depression and anxiety coping/management skills.  Continued recommendations are as follows: self care behaviors, positive social engagements, focusing on overall work/home/life balance, and focusing on positive physical and emotional wellness.   Suicidal/Homicidal: No  Therapist Response: Denise Knapp is able to articulate coping skills that help her manage symptoms of both depression and anxiety. Denise Knapp is working hard to develop and maintain relationships that help prevent depression relapse. These behaviors are reflective of both personal growth and progress. Treatment to continue as indicated.  Plan: Return again in 8 weeks.  Diagnosis: Axis I: MDD, recurrent, in remission    Axis II: No diagnosis    Denise Knapp , LCSW 09/18/2020

## 2020-10-01 ENCOUNTER — Other Ambulatory Visit: Payer: Self-pay | Admitting: Psychiatry

## 2020-10-01 DIAGNOSIS — F3342 Major depressive disorder, recurrent, in full remission: Secondary | ICD-10-CM

## 2020-10-16 ENCOUNTER — Other Ambulatory Visit: Payer: Self-pay | Admitting: Psychiatry

## 2020-10-16 DIAGNOSIS — F3342 Major depressive disorder, recurrent, in full remission: Secondary | ICD-10-CM

## 2020-10-30 ENCOUNTER — Telehealth (INDEPENDENT_AMBULATORY_CARE_PROVIDER_SITE_OTHER): Payer: Medicare PPO | Admitting: Psychiatry

## 2020-10-30 ENCOUNTER — Encounter: Payer: Self-pay | Admitting: Psychiatry

## 2020-10-30 ENCOUNTER — Other Ambulatory Visit: Payer: Self-pay

## 2020-10-30 DIAGNOSIS — F3342 Major depressive disorder, recurrent, in full remission: Secondary | ICD-10-CM

## 2020-10-30 DIAGNOSIS — F09 Unspecified mental disorder due to known physiological condition: Secondary | ICD-10-CM

## 2020-10-30 DIAGNOSIS — Z8659 Personal history of other mental and behavioral disorders: Secondary | ICD-10-CM | POA: Diagnosis not present

## 2020-10-30 NOTE — Progress Notes (Signed)
Virtual Visit via Video Note  I connected with Denise Knapp on 10/30/20 at  9:00 AM EDT by a video enabled telemedicine application and verified that I am speaking with the correct person using two identifiers.  Location Provider Location : [facility] Patient Location : Home  Participants: Patient , Provider   I discussed the limitations of evaluation and management by telemedicine and the availability of in person appointments. The patient expressed understanding and agreed to proceed.     I discussed the assessment and treatment plan with the patient. The patient was provided an opportunity to ask questions and all were answered. The patient agreed with the plan and demonstrated an understanding of the instructions.   The patient was advised to call back or seek an in-person evaluation if the symptoms worsen or if the condition fails to improve as anticipated.   [facility] MD OP Progress Note  10/30/2020 12:07 PM Denise Knapp  MRN:  [id number]  Chief Complaint:  Chief Complaint   Follow-up; Anxiety; Depression    HPI: Denise Knapp is a 65 year old Caucasian female, retired, lives in [place], on [place], has a history of MDD, obstructive sleep apnea, hypothyroidism, interstitial lung disease, diabetes melitis, hyperlipidemia was evaluated by telemedicine today.  Patient today reports she is doing fairly well with regards to her mood.  Denies any significant mood swings.  Denies any significant anxiety or sadness.  She reports appetite is fair.  She has been trying to lose weight and was able to lose a few pounds in the past 4 months.  Patient reports sleep is overall okay.  Patient denies any suicidality, homicidality or perceptual disturbances.  Patient is compliant on medications.  Denies side effects.  Patient with chronic lung disease-fibrotic NSIP, diagnosed with lung disease 8 years ago, currently uses oxygen-3L/min  per minute.  PFT recently-14%, improvement in spirometry from February  2022.  Currently on prednisone, CellCept.  Patient continues to have good support system from family.  Visit Diagnosis:    ICD-10-CM   1. MDD (major depressive disorder), recurrent, in full remission ([place])  F33.42     2. Mild cognitive disorder  F09     3. History of psychosis  Z86.59     4. History of agoraphobia  Z86.59       Past Psychiatric History: Reviewed past psychiatric history from progress note on 09/19/2019  Past Medical History:  Past Medical History:  Diagnosis Date   Asthma    Diabetes mellitus without complication ([place])    History reviewed. No pertinent surgical history.  Family Psychiatric History: Reviewed family psychiatric history from progress note on 09/19/2019  Family History:  Family History  Problem Relation Age of Onset   Depression Mother    Mental illness Daughter    Mental illness Daughter     Social History: Reviewed social history from progress note on 09/19/2019 Social History   Socioeconomic History   Marital status: Legally Separated    Spouse name: Not on file   Number of children: 2   Years of education: Not on file   Highest education level: Not on file  Occupational History   Occupation: retired  Tobacco Use   Smoking status: Never   Smokeless tobacco: Never  Substance and Sexual Activity   Alcohol use: Never   Drug use: Never   Sexual activity: Not Currently  Other Topics Concern   Not on file  Social History Narrative   Completed bachelors degree, [place] for [employer] -  for 30 years - SSD/[employer] Claims work   On [employer]   Social Determinants of Radio broadcast assistant Strain: Medium Risk   Difficulty of Paying Living Expenses: Somewhat hard  Food Insecurity: Not on file  Transportation Needs: Not on file  Physical Activity: Not on file  Stress: Not on file  Social Connections: Not on file    Allergies:  Allergies  Allergen Reactions   Nintedanib Diarrhea and Nausea And Vomiting    Per pt  report   Penicillins Itching and Other (See Comments)    Yeast infection Pt reports no known drug allergies, but chart from [facility] shows Penicillins Reports hx of yeast infection during course of therapy. Yeast infection Yeast infection Pt reports no known drug allergies, but chart from [facility] shows Penicillins Pt reports no known drug allergies, but chart from [facility] shows Penicillins     Metabolic Disorder Labs: No results found for: HGBA1C, MPG No results found for: PROLACTIN No results found for: CHOL, TRIG, HDL, CHOLHDL, VLDL, LDLCALC No results found for: TSH  Therapeutic Level Labs: No results found for: LITHIUM No results found for: VALPROATE No components found for:  CBMZ  Current Medications: Current Outpatient Medications  Medication Sig Dispense Refill   levothyroxine (SYNTHROID) 137 MCG tablet Take by mouth.     Sod Fluoride-Potassium Nitrate 1.1-5 % PSTE See admin instructions.     sulfamethoxazole-trimethoprim (BACTRIM DS) 800-160 MG tablet Take by mouth.     albuterol (ACCUNEB) 1.25 MG/3ML nebulizer solution      alendronate (FOSAMAX) 35 MG tablet Take by mouth.     ascorbic acid (VITAMIN C) 100 MG tablet Take by mouth.     aspirin 81 MG EC tablet Take by mouth.     b complex vitamins tablet Take by mouth.     Blood Glucose Monitoring Suppl (FIFTY50 GLUCOSE METER 2.0) w/Device KIT Use as instructed     Blood Glucose Monitoring Suppl (ONETOUCH VERIO) w/Device KIT Use to test blood sugar     budesonide (PULMICORT) 0.5 MG/2ML nebulizer solution      budesonide (PULMICORT) 0.5 MG/2ML nebulizer solution Inhale into the lungs.     buPROPion (WELLBUTRIN XL) 150 MG 24 hr tablet TAKE 2 TABLETS (300 MG TOTAL) BY MOUTH DAILY WITH BREAKFAST. 180 tablet 1   Calcium Carbonate-Vitamin D 600-200 MG-UNIT TABS Take by mouth.     Cholecalciferol 25 MCG (1000 UT) capsule Take by mouth.     donepezil (ARICEPT) 5 MG tablet Take by mouth.     Dulaglutide 1.5 MG/0.5ML SOPN Inject into the  skin.     enalapril (VASOTEC) 2.5 MG tablet Take 2.5 mg by mouth daily.     ESBRIET 267 MG TABS      famotidine (PEPCID) 40 MG tablet Take 40 mg by mouth daily.     fluconazole (DIFLUCAN) 100 MG tablet Take by mouth.     FLUoxetine (PROZAC) 40 MG capsule TAKE 1 CAPSULE BY MOUTH EVERY DAY 90 capsule 0   fluticasone (FLONASE) 50 MCG/ACT nasal spray Place into the nose.     glucose blood (PRECISION QID TEST) test strip by Other route 2 (two) times a day with meals. Please dispense 50 ultra fine lancets     guaifenesin (ROBITUSSIN) 100 MG/5ML syrup Take by mouth.     ibuprofen (ADVIL) 200 MG tablet Take by mouth.     levothyroxine (SYNTHROID) 137 MCG tablet Take by mouth.     loperamide (IMODIUM) 2 MG capsule Take by mouth.  lovastatin (MEVACOR) 40 MG tablet Take 40 mg by mouth daily.     meclizine (ANTIVERT) 25 MG tablet Take by mouth.     metFORMIN (GLUCOPHAGE) 500 MG tablet TAKE 2 TABLETS BY MOUTH IN THE MORNING AND 1 TABLET IN THE EVENING     Multiple Vitamin (MULTI-VITAMIN) tablet Take by mouth.     mycophenolate (CELLCEPT) 500 MG tablet TAKE 2 TABLETS BY MOUTH 2 TIMES DAILY     OFEV 150 MG CAPS      omeprazole (PRILOSEC) 20 MG capsule Take by mouth.     OneTouch Delica Lancets 71G MISC      ONETOUCH ULTRA test strip      ONETOUCH VERIO test strip 1 each daily.     Pirfenidone (ESBRIET) 267 MG TABS Take by mouth.     predniSONE (DELTASONE) 10 MG tablet      risperiDONE (RISPERDAL) 2 MG tablet TAKE 1 TABLET BY MOUTH AT BEDTIME. 90 tablet 0   sodium chloride (OCEAN) 0.65 % nasal spray Place into the nose.     traMADol (ULTRAM) 50 MG tablet Take 50 mg by mouth every 6 (six) hours as needed.     TRULICITY 1.5 GY/6.9SW SOPN SMARTSIG:0.5 Milliliter(s) SUB-Q Once a Week     Vitamin D, Ergocalciferol, (DRISDOL) 1.25 MG (50000 UNIT) CAPS capsule      No current facility-administered medications for this visit.     Musculoskeletal: Strength & Muscle Tone:  UTA Gait & Station:   UTA Patient leans: N/A  Psychiatric Specialty Exam: Review of Systems  Psychiatric/Behavioral:  Negative for agitation, behavioral problems, confusion, decreased concentration, dysphoric mood, hallucinations, self-injury, sleep disturbance and suicidal ideas. The patient is not nervous/anxious and is not hyperactive.   All other systems reviewed and are negative.  There were no vitals taken for this visit.There is no height or weight on file to calculate BMI.  General Appearance: Casual  Eye Contact:  Good  Speech:  Clear and Coherent  Volume:  Normal  Mood:  Euthymic  Affect:  Appropriate  Thought Process:  Goal Directed and Descriptions of Associations: Intact  Orientation:  Full (Time, Place, and Person)  Thought Content: Logical   Suicidal Thoughts:  No  Homicidal Thoughts:  No  Memory:  Immediate;   Fair Recent;   Fair Remote;   Fair  Judgement:  Fair  Insight:  Fair  Psychomotor Activity:  Normal  Concentration:  Concentration: Fair and Attention Span: Fair  Recall:  [employer] of Knowledge: Fair  Language: Fair  Akathisia:  No  Handed:  Right  AIMS (if indicated): not done  Assets:  Communication Skills Desire for Improvement Social Support Transportation Vocational/Educational  ADL's:  Intact  Cognition: WNL  Sleep:  Fair   Screenings: GAD-7    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/28/2019 in [facility]  Total GAD-7 Score 13      PHQ2-9    [place] Video Visit from 10/30/2020 in [facility] from 09/18/2020 in [facility] from 07/11/2020 in [facility] Video Visit from 05/30/2020 in [facility] from 11/28/2019 in [facility]  PHQ-2 Total Score 0 0 0 1 1  PHQ-9 Total Score -- -- -- -- 10      Flowsheet Row Video Visit from 10/30/2020 in [facility] from 09/18/2020 in [facility] from 07/11/2020 in [facility]  Low Risk No Risk No Risk        Assessment and Plan: Denise Knapp is a 65 year old Caucasian female, divorced, on SSD, lives in [place], has a history of MDD, interstitial lung disease, obstructive sleep apnea, morbid obesity was evaluated by telemedicine today.  Patient is currently stable however has chronic medical issues including lung disease which does have an impact on her mood although she is coping well.  Plan as noted below.  Plan MDD in remission Prozac 40 mg p.o. daily Wellbutrin XL 300 mg p.o. daily Risperidone 2 mg p.o. nightly.  Cognitive disorder likely mild-stable Continue Aricept as prescribed by neurology.  Patient has been referred for pulmonary rehabilitation.  Follow-up in clinic in 3 months in office.  This note was generated in part or whole with voice recognition software. Voice recognition is usually quite accurate but there are transcription errors that can and very often do occur. I apologize for any typographical errors that were not detected and corrected.       Ursula Alert, MD 10/30/2020, 12:07 PM

## 2020-10-31 ENCOUNTER — Other Ambulatory Visit: Payer: Self-pay

## 2020-10-31 ENCOUNTER — Encounter: Payer: Self-pay | Attending: Pulmonary Disease | Admitting: *Deleted

## 2020-10-31 DIAGNOSIS — J849 Interstitial pulmonary disease, unspecified: Secondary | ICD-10-CM

## 2020-10-31 NOTE — Progress Notes (Signed)
Initial telephone orientation completed. Diagnosis can be found in [facility] 7/7. EP orientation scheduled for Thursday 7/28 at 8am.

## 2020-11-20 ENCOUNTER — Ambulatory Visit (INDEPENDENT_AMBULATORY_CARE_PROVIDER_SITE_OTHER): Payer: Medicare PPO | Admitting: Licensed Clinical Social Worker

## 2020-11-20 ENCOUNTER — Other Ambulatory Visit: Payer: Self-pay

## 2020-11-20 DIAGNOSIS — F3342 Major depressive disorder, recurrent, in full remission: Secondary | ICD-10-CM

## 2020-11-20 NOTE — Progress Notes (Signed)
Virtual Visit via Audio Note  I connected with Denise Knapp on 11/20/20 at 11:00 AM EDT by an audio enabled telemedicine application and verified that I am speaking with the correct person using two identifiers.  Location: Patient: home Provider: remote office [place], [name]   I discussed the limitations of evaluation and management by telemedicine and the availability of in person appointments. The patient expressed understanding and agreed to proceed.   I discussed the assessment and treatment plan with the patient. The patient was provided an opportunity to ask questions and all were answered. The patient agreed with the plan and demonstrated an understanding of the instructions.   The patient was advised to call back or seek an in-person evaluation if the symptoms worsen or if the condition fails to improve as anticipated.  I provided 30 minutes of non-face-to-face time during this encounter.    R , LCSW   THERAPIST PROGRESS NOTE  Session Time: 11-11:30a  Participation Level: Active  Behavioral Response: NAAlertAnxious  Type of Therapy: Individual Therapy  Treatment Goals addressed: Anxiety and Coping  Interventions: Supportive  Summary: Denise Knapp is a 65 y.o. female who presents with improving symptoms related to depression diagnosis. Patient reports that she is managing stress and anxiety well. Patient reports that she physically does not feel well during session today, but wants to continue with her appointment. Allow patient safe space to explore and express thoughts and feelings associated with recent external stressors and life events. Patient reports that everything is going well in her life. Patient reports that she has good relationships with her daughters, her brother, and her mother. Patient recently heard from brother after an extended period of time, so she is very happy to hear from him. Patient reports that her daughter and granddaughter are  coming to visit from [place] in October, and she is very excited about this visit. Patient reports that this particular daughter is going through a divorce, so she is happy that she will be able to help her daughter. Patient reported multiple times throughout the session that she is concerned about her daughters and that "they don't have enough food to eat". when asked patient to explore that concern, patient states that they often take pills instead of eating food. Patient seems to feel that they do this because they do not have food. Patient reports some stress over an increase in her rent--patient states that she is going to have to cut back in other areas so that she will be able to afford her rent. Overall patient states that she is feeling good, improvement in her depression symptoms, and patient feels like she has good coping mechanisms if she does have a symptom relapse.Continued recommendations are as follows: self care behaviors, positive social engagements, focusing on overall work/home/life balance, and focusing on positive physical and emotional wellness.  .   Suicidal/Homicidal: No  Therapist Response: Patient reports that she feels that her overall mood is elevated and that she is engaging in activities that she enjoys, especially socialization. patient continues to make positive statements about self and her ability to cope with life stresses both present and past.These behaviors are reflective of both personal growth and progress. Treatment to continue as indicated.   Plan: Return again in 8 weeks.  Diagnosis: Axis I: MDD, recurrent, in remission.    Axis II: No diagnosis    Denise Knapp , LCSW 11/20/2020

## 2020-12-29 ENCOUNTER — Other Ambulatory Visit: Payer: Self-pay | Admitting: Psychiatry

## 2020-12-29 DIAGNOSIS — F3342 Major depressive disorder, recurrent, in full remission: Secondary | ICD-10-CM

## 2021-01-15 ENCOUNTER — Other Ambulatory Visit: Payer: Self-pay

## 2021-01-15 ENCOUNTER — Encounter: Payer: Self-pay | Admitting: Psychiatry

## 2021-01-15 ENCOUNTER — Telehealth (INDEPENDENT_AMBULATORY_CARE_PROVIDER_SITE_OTHER): Payer: Medicare PPO | Admitting: Psychiatry

## 2021-01-15 DIAGNOSIS — F09 Unspecified mental disorder due to known physiological condition: Secondary | ICD-10-CM | POA: Diagnosis not present

## 2021-01-15 DIAGNOSIS — F3342 Major depressive disorder, recurrent, in full remission: Secondary | ICD-10-CM

## 2021-01-15 DIAGNOSIS — Z8659 Personal history of other mental and behavioral disorders: Secondary | ICD-10-CM | POA: Diagnosis not present

## 2021-01-15 DIAGNOSIS — Z79899 Other long term (current) drug therapy: Secondary | ICD-10-CM | POA: Insufficient documentation

## 2021-01-15 NOTE — Progress Notes (Signed)
Virtual Visit via Video Note  I connected with Denise Knapp on 01/15/21 at 10:30 AM EDT by a video enabled telemedicine application and verified that I am speaking with the correct person using two identifiers.  Location Provider Location : [facility] Patient Location : [place]  Participants: Patient ,sister Denise Knapp, Provider   I discussed the limitations of evaluation and management by telemedicine and the availability of in person appointments. The patient expressed understanding and agreed to proceed.   I discussed the assessment and treatment plan with the patient. The patient was provided an opportunity to ask questions and all were answered. The patient agreed with the plan and demonstrated an understanding of the instructions.   The patient was advised to call back or seek an in-person evaluation if the symptoms worsen or if the condition fails to improve as anticipated.   [facility] Progress Note  01/15/2021 1:31 PM Denise Knapp  MRN:  [id number]  Chief Complaint:  Chief Complaint   Follow-up; Anxiety; Depression    HPI: Denise Knapp is a 65 year old Caucasian female, retired, lives in [place], has a history of MDD, obstructive sleep apnea, hypothyroidism, interstitial lung disease, diabetes mellitus, hyperlipidemia was evaluated by telemedicine today.  Patient as well as sister participated in the evaluation today.  Patient today reports that as long as she is able to communicate and spend time with her mom and her daughter her mood symptoms are stable.  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  She reports she has been referred to [facility] rehab by her pulmonologist.  She does not know if she is going to follow through with that due to financial reasons.  Patient reports her appetite is reduced the past few weeks however she has been eating enough for herself.  Patient denies any suicidality,  homicidality or perceptual disturbances.  Per sister patient is currently doing well with regards to her mood.  She seems to be compliant on medications.  She does not seem to have any side effects to medications.  She continues to struggle with her lung problems and does need support.     Visit Diagnosis:    ICD-10-CM   1. MDD (major depressive disorder), recurrent, in full remission ([place])  F33.42     2. Mild cognitive disorder  F09     3. High risk medication use  Z79.899 Prolactin    Lipid panel    4. History of agoraphobia  Z86.59     5. History of psychosis  Z86.59       Past Psychiatric History: I have reviewed past psychiatric history from progress note on 09/19/2019.  Past Medical History:  Past Medical History:  Diagnosis Date   Asthma    Diabetes mellitus without complication ([place])    History reviewed. No pertinent surgical history.  Family Psychiatric History: Reviewed family psychiatric history from progress note on 09/19/2019.  Family History:  Family History  Problem Relation Age of Onset   Depression Mother    Mental illness Daughter    Mental illness Daughter     Social History: Reviewed social history from progress note on 09/19/2019. Social History   Socioeconomic History   Marital status: Legally Separated    Spouse name: Not on file   Number of children: 2   Years of education: Not on file   Highest education level: Not on file  Occupational History   Occupation: retired  Tobacco Use  Smoking status: Never   Smokeless tobacco: Never  Substance and Sexual Activity   Alcohol use: Never   Drug use: Never   Sexual activity: Not Currently  Other Topics Concern   Not on file  Social History Narrative   Completed bachelors degree, Designer, jewellery   Worked for [employer] - for 30 years - SSD/SSI Claims work   On [employer]   Social Determinants of Radio broadcast assistant Strain: Not on [employer] Insecurity: Not on file  Transportation  Needs: Not on file  Physical Activity: Not on file  Stress: Not on file  Social Connections: Not on file    Allergies:  Allergies  Allergen Reactions   Nintedanib Diarrhea and Nausea And Vomiting    Per pt report   Penicillins Itching and Other (See Comments)    Yeast infection Pt reports no known drug allergies, but chart from [facility] shows Penicillins Reports hx of yeast infection during course of therapy. Yeast infection Yeast infection Pt reports no known drug allergies, but chart from [facility] shows Penicillins Pt reports no known drug allergies, but chart from [facility] shows Penicillins     Metabolic Disorder Labs: No results found for: HGBA1C, MPG No results found for: PROLACTIN No results found for: CHOL, TRIG, HDL, CHOLHDL, VLDL, LDLCALC No results found for: TSH  Therapeutic Level Labs: No results found for: LITHIUM No results found for: VALPROATE No components found for:  CBMZ  Current Medications: Current Outpatient Medications  Medication Sig Dispense Refill   enalapril (VASOTEC) 2.5 MG tablet Take 1 tablet by mouth daily.     albuterol (ACCUNEB) 1.25 MG/3ML nebulizer solution      alendronate (FOSAMAX) 35 MG tablet Take by mouth. (Patient not taking: Reported on 10/31/2020)     Apoaequorin 10 MG CAPS Take by mouth.     ascorbic acid (VITAMIN C) 100 MG tablet Take by mouth.     aspirin 81 MG EC tablet Take by mouth.     b complex vitamins tablet Take by mouth.     Blood Glucose Monitoring Suppl (FIFTY50 GLUCOSE METER 2.0) w/Device KIT Use as instructed     Blood Glucose Monitoring Suppl (ONETOUCH VERIO) w/Device KIT Use to test blood sugar     budesonide (PULMICORT) 0.5 MG/2ML nebulizer solution      budesonide (PULMICORT) 0.5 MG/2ML nebulizer solution Inhale into the lungs.     buPROPion (WELLBUTRIN XL) 150 MG 24 hr tablet TAKE 2 TABLETS (300 MG TOTAL) BY MOUTH DAILY WITH BREAKFAST. 180 tablet 1   Calcium Carbonate-Vitamin D 600-200 MG-UNIT TABS Take by mouth.      Cholecalciferol 25 MCG (1000 UT) capsule Take by mouth.     dapagliflozin propanediol (FARXIGA) 10 MG TABS tablet Take 1 tablet by mouth daily.     donepezil (ARICEPT) 5 MG tablet Take by mouth.     Dulaglutide 1.5 MG/0.5ML SOPN Inject into the skin.     enalapril (VASOTEC) 2.5 MG tablet Take 2.5 mg by mouth daily.     ESBRIET 267 MG TABS      famotidine (PEPCID) 40 MG tablet Take 40 mg by mouth daily.     fluconazole (DIFLUCAN) 100 MG tablet Take by mouth. (Patient not taking: Reported on 10/31/2020)     FLUoxetine (PROZAC) 40 MG capsule TAKE 1 CAPSULE BY MOUTH EVERY DAY 90 capsule 0   fluticasone (FLONASE) 50 MCG/ACT nasal spray Place into the nose.     glucose blood (PRECISION QID TEST) test strip  by Other route 2 (two) times a day with meals. Please dispense 50 ultra fine lancets     guaifenesin (ROBITUSSIN) 100 MG/5ML syrup Take by mouth. (Patient not taking: Reported on 10/31/2020)     ibuprofen (ADVIL) 200 MG tablet Take by mouth.     levothyroxine (SYNTHROID) 137 MCG tablet Take by mouth.     levothyroxine (SYNTHROID) 137 MCG tablet Take by mouth.     loperamide (IMODIUM) 2 MG capsule Take by mouth.     lovastatin (MEVACOR) 40 MG tablet Take 40 mg by mouth daily.     meclizine (ANTIVERT) 25 MG tablet Take by mouth.     metFORMIN (GLUCOPHAGE) 500 MG tablet TAKE 2 TABLETS BY MOUTH IN THE MORNING AND 1 TABLET IN THE EVENING     Multiple Vitamin (MULTI-VITAMIN) tablet Take by mouth.     mycophenolate (CELLCEPT) 500 MG tablet TAKE 2 TABLETS BY MOUTH 2 TIMES DAILY     OFEV 150 MG CAPS      omeprazole (PRILOSEC) 20 MG capsule Take by mouth.     OneTouch Delica Lancets 66Y MISC      ONETOUCH ULTRA test strip      ONETOUCH VERIO test strip 1 each daily.     Pirfenidone (ESBRIET) 267 MG TABS Take by mouth.     predniSONE (DELTASONE) 10 MG tablet      risperiDONE (RISPERDAL) 2 MG tablet TAKE 1 TABLET BY MOUTH AT BEDTIME. 90 tablet 0   Sod Fluoride-Potassium Nitrate 1.1-5 % PSTE See admin  instructions.     sodium chloride (OCEAN) 0.65 % nasal spray Place into the nose.     sulfamethoxazole-trimethoprim (BACTRIM DS) 800-160 MG tablet Take by mouth.     traMADol (ULTRAM) 50 MG tablet Take 50 mg by mouth every 6 (six) hours as needed. (Patient not taking: Reported on 07/15/4740)     TRULICITY 1.5 VZ/5.6LO SOPN SMARTSIG:0.5 Milliliter(s) SUB-Q Once a Week     Vitamin D, Ergocalciferol, (DRISDOL) 1.25 MG (50000 UNIT) CAPS capsule      No current facility-administered medications for this visit.     Musculoskeletal: Strength & Muscle Tone:  UTA Gait & Station:  Seated Patient leans: Backward  Psychiatric Specialty Exam: Review of Systems  Psychiatric/Behavioral:  The patient is nervous/anxious.   All other systems reviewed and are negative.  There were no vitals taken for this visit.There is no height or weight on file to calculate BMI.  General Appearance: Casual  Eye Contact:  Fair  Speech:  Clear and Coherent  Volume:  Normal  Mood:  Anxious  Affect:  Appropriate  Thought Process:  Goal Directed and Descriptions of Associations: Intact  Orientation:  Full (Time, Place, and Person)  Thought Content: Logical   Suicidal Thoughts:  No  Homicidal Thoughts:  No  Memory:  Immediate;   Fair Recent;   Fair Remote;   limited  Judgement:  Fair  Insight:  Fair  Psychomotor Activity:  Normal  Concentration:  Concentration: Fair and Attention Span: Fair  Recall:  [employer] of Knowledge: Fair  Language: Fair  Akathisia:  No  Handed:  Right  AIMS (if indicated): done  Assets:  Communication Skills Desire for Improvement Housing Social Support  ADL's:  Intact  Cognition: WNL  Sleep:  Fair   Screenings: GAD-7    Flowsheet Row Video Visit from 01/15/2021 in [facility] Counselor from 11/28/2019 in [facility]  Total GAD-7 Score 5 13  PHQ2-9    Flowsheet Row Video Visit from 01/15/2021 in [facility] Counselor from 11/20/2020 in [facility] Video Visit from 10/30/2020 in [facility] from 09/18/2020 in [facility] from 07/11/2020 in [facility]  PHQ-2 Total Score 0 0 0 0 0  PHQ-9 Total Score 3 -- -- -- --      Flowsheet Row Counselor from 11/20/2020 in [facility] Video Visit from 10/30/2020 in [facility] Counselor from 09/18/2020 in [facility] Low Risk Low Risk No Risk        Assessment and Plan: Dajai Wahlert is a 65 year old Caucasian female, divorced, on SSD, lives in [place], has a history of MDD, interstitial lung disease, obstructive sleep apnea, morbid obesity, was evaluated by telemedicine today.  Patient is currently stable with regards to her mood.  Plan as noted below.  Plan MDD in remission Prozac 40 mg p.o. daily Wellbutrin XL 300 mg p.o. daily. Risperidone 2 mg p.o. nightly  Cognitive disorder likely mild-stable Aricept as prescribed by neurology  High risk medication use-will order lipid panel, prolactin.  Patient to go to [facility] lab. Reviewed and discussed most recent hemoglobin A1c-elevated at 10.  Patient to continue to follow up with primary care provider.  CMP-glucose elevated to 54, ALT-elevated at 49 dated 12/31/2020, CBC with differential-WBC-elevated at 13.9, immature granulocyte-1.2, immature granulocyte count-elevated at 0.17, lymphocyte-8.8-low, neutrophil-elevated at 82.4.  Patient continues to be under the care of her providers.  Patient to continue to follow up with pulmonologist, has been referred to pulmonary rehab.  Collateral information obtained from sister.  Follow-up in clinic in 2 to 3 months in person.  Patient will check her daughter's  schedule and call the practice to schedule  this appointment since she needs transportation by her daughter.  This note was generated in part or whole with voice recognition software. Voice recognition is usually quite accurate but there are transcription errors that can and very often do occur. I apologize for any typographical errors that were not detected and corrected.     Ursula Alert, MD 01/15/2021, 1:31 PM

## 2021-01-24 ENCOUNTER — Ambulatory Visit (INDEPENDENT_AMBULATORY_CARE_PROVIDER_SITE_OTHER): Payer: Medicare PPO | Admitting: Licensed Clinical Social Worker

## 2021-01-24 ENCOUNTER — Other Ambulatory Visit: Payer: Self-pay

## 2021-01-24 DIAGNOSIS — F3342 Major depressive disorder, recurrent, in full remission: Secondary | ICD-10-CM | POA: Diagnosis not present

## 2021-01-24 NOTE — Progress Notes (Signed)
Attempt 1:  11:03  Phone call--left message

## 2021-01-24 NOTE — Progress Notes (Signed)
Virtual Visit via Audio Note  I connected with Denise Knapp on 01/24/21 at 11:00 AM EDT by an audio enabled telemedicine application and verified that I am speaking with the correct person using two identifiers.  Location: Patient: home Provider: [facility]   I discussed the limitations of evaluation and management by telemedicine and the availability of in person appointments. The patient expressed understanding and agreed to proceed.    I discussed the assessment and treatment plan with the patient. The patient was provided an opportunity to ask questions and all were answered. The patient agreed with the plan and demonstrated an understanding of the instructions.   The patient was advised to call back or seek an in-person evaluation if the symptoms worsen or if the condition fails to improve as anticipated.  I provided 40 minutes of non-face-to-face time during this encounter.    R , LCSW   THERAPIST PROGRESS NOTE  Session Time: 11-1140a  Participation Level: None  Behavioral Response: NAAlertEuthymic  Type of Therapy: Individual Therapy  Treatment Goals addressed:   Goal: LTG: Reduce frequency, intensity, and duration of depression symptoms as evidenced by: pt self report Outcome: Progressing  Goal: STG: [name] WILL PARTICIPATE IN AT LEAST 80% OF SCHEDULED INDIVIDUAL PSYCHOTHERAPY SESSIONS Description: Pt is on time for appt Outcome: Progressing Interventions:  Intervention: WORK WITH [name] TO IDENTIFY THE MAJOR COMPONENTS OF A RECENT EPISODE OF DEPRESSION: PHYSICAL SYMPTOMS, MAJOR THOUGHTS AND IMAGES, AND MAJOR BEHAVIORS THEY EXPERIENCED  Intervention: Support useful positive self-talk  Intervention: Assess emotional status and coping mechanisms  Summary: Denise Knapp is a 65 y.o. female who presents with improving symptoms related to depression diagnosis. Pt reports stable mood and good quality/quantity of sleep. Pt reports that she is compliant with  medication and has no significant side effects.   Allowed pt to explore and express thoughts and feelings associated with recent life situations and external stressors. Pt reports that she is happy with everything going on with her life: pt happy with living situation, close proximity to one daughter that she is close to, regular communication with other daughter in [place], pt remains in contact with brother, sister, and mother. Pt reports that she does not feel financial stress or burdens at this time. Pt reports she still has anxiety about children--but  is getting better about this.   Continued recommendations are as follows: self care behaviors, positive social engagements, focusing on overall work/home/life balance, and focusing on positive physical and emotional wellness. .   Suicidal/Homicidal: No  Therapist Response: Pt is continuing to apply interventions learned in session into daily life situations. Pt is currently on track to meet goals utilizing interventions mentioned above. Personal growth and progress noted. Treatment to continue as indicated.   Plan: Return again in 4 weeks.  Diagnosis: Axis I: MDD, recurrent   Axis II: No diagnosis    [name] , LCSW 01/24/2021

## 2021-01-24 NOTE — Plan of Care (Signed)
  Problem: Decrease depressive symptoms and improve levels of effective functioning Goal: LTG: Reduce frequency, intensity, and duration of depression symptoms as evidenced by: pt self report Outcome: Progressing Goal: STG: [place] WILL PARTICIPATE IN AT LEAST 80% OF SCHEDULED INDIVIDUAL PSYCHOTHERAPY SESSIONS Description: Pt is on time for appt Outcome: Progressing Intervention: WORK WITH [name] TO IDENTIFY THE MAJOR COMPONENTS OF A RECENT EPISODE OF DEPRESSION: PHYSICAL SYMPTOMS, MAJOR THOUGHTS AND IMAGES, AND MAJOR BEHAVIORS THEY EXPERIENCED Intervention: Support useful positive self-talk Intervention: Assess emotional status and coping mechanisms

## 2021-01-25 ENCOUNTER — Other Ambulatory Visit
Admission: RE | Admit: 2021-01-25 | Discharge: 2021-01-25 | Disposition: A | Discharge status: Home / Self Care | Payer: Medicare PPO | Attending: Psychiatry | Admitting: Psychiatry

## 2021-01-25 DIAGNOSIS — Z79899 Other long term (current) drug therapy: Secondary | ICD-10-CM | POA: Diagnosis present

## 2021-01-25 LAB — LIPID PANEL
Cholesterol: 201 mg/dL — ABNORMAL HIGH (ref 0–200)
HDL: 50 mg/dL (ref >40)
LDL Cholesterol: 106 mg/dL — ABNORMAL HIGH (ref 0–99)
Total CHOL/HDL Ratio: 4 RATIO
Triglycerides: 224 mg/dL — ABNORMAL HIGH (ref <150)
VLDL: 45 mg/dL — ABNORMAL HIGH (ref 0–40)

## 2021-01-26 LAB — PROLACTIN: Prolactin: 41.8 ng/mL — ABNORMAL HIGH (ref 4.8–23.3)

## 2021-01-29 ENCOUNTER — Telehealth: Payer: Self-pay | Admitting: Psychiatry

## 2021-01-29 NOTE — Telephone Encounter (Signed)
Contacted patient about her lab results-lipid panel-abnormal.  She will contact primary care.  Also discussed prolactin level being elevated, likely due to risperidone.  She is asymptomatic.  Continue medications as prescribed at this time.

## 2021-03-13 ENCOUNTER — Other Ambulatory Visit: Payer: Self-pay | Admitting: Child and Adolescent Psychiatry

## 2021-03-13 DIAGNOSIS — F3342 Major depressive disorder, recurrent, in full remission: Secondary | ICD-10-CM

## 2021-03-13 NOTE — Telephone Encounter (Signed)
Dr. Eappen's pt

## 2021-03-20 ENCOUNTER — Ambulatory Visit: Payer: Medicare PPO | Admitting: Psychiatry

## 2021-03-25 ENCOUNTER — Telehealth: Payer: Self-pay

## 2021-03-25 ENCOUNTER — Other Ambulatory Visit: Payer: Self-pay | Admitting: Psychiatry

## 2021-03-25 DIAGNOSIS — F3342 Major depressive disorder, recurrent, in full remission: Secondary | ICD-10-CM

## 2021-03-25 MED ORDER — RISPERIDONE 2 MG PO TABS: 2.0000 mg | ORAL_TABLET | Freq: Every day | ORAL | 0 refills | Status: DC

## 2021-03-25 NOTE — Telephone Encounter (Signed)
pt called left message that she needs refills on risperidone

## 2021-03-25 NOTE — Telephone Encounter (Signed)
Ordered

## 2021-03-26 ENCOUNTER — Other Ambulatory Visit: Payer: Self-pay

## 2021-03-26 ENCOUNTER — Ambulatory Visit (INDEPENDENT_AMBULATORY_CARE_PROVIDER_SITE_OTHER): Payer: Medicare PPO | Admitting: Licensed Clinical Social Worker

## 2021-03-26 DIAGNOSIS — F3342 Major depressive disorder, recurrent, in full remission: Secondary | ICD-10-CM | POA: Diagnosis not present

## 2021-03-26 NOTE — Progress Notes (Signed)
Virtual Visit via Audio Note  I connected with Denise Knapp on 03/26/21 at  4:00 PM EST by an audio enabled telemedicine application and verified that I am speaking with the correct person using two identifiers.  Location: Patient: home Provider: [facility]   I discussed the limitations of evaluation and management by telemedicine and the availability of in person appointments. The patient expressed understanding and agreed to proceed.    I discussed the assessment and treatment plan with the patient. The patient was provided an opportunity to ask questions and all were answered. The patient agreed with the plan and demonstrated an understanding of the instructions.   The patient was advised to call back or seek an in-person evaluation if the symptoms worsen or if the condition fails to improve as anticipated.  I provided 30 minutes of non-face-to-face time during this encounter.    R , LCSW   THERAPIST PROGRESS NOTE  Session Time: 40-430p  Participation Level: None  Behavioral Response: NAAlertEuthymic  Type of Therapy: Individual Therapy  Treatment Goals addressed:   Goal: LTG: Reduce frequency, intensity, and duration of depression symptoms as evidenced by: pt self report Outcome: Progressing  Goal: STG: Denise Knapp WILL PARTICIPATE IN AT LEAST 80% OF SCHEDULED INDIVIDUAL PSYCHOTHERAPY SESSIONS Description: Pt is on time for appt Outcome: Progressing  Interventions:  Intervention: WORK WITH Denise Knapp TO IDENTIFY THE MAJOR COMPONENTS OF A RECENT EPISODE OF DEPRESSION: PHYSICAL SYMPTOMS, MAJOR THOUGHTS AND IMAGES, AND MAJOR BEHAVIORS THEY EXPERIENCED  Intervention: Support useful positive self-talk  Intervention: Assess emotional status and coping mechanisms  Summary: Denise Knapp is a 65 y.o. female who presents with improving symptoms related to depression diagnosis. Pt reports stable mood and good quality/quantity of sleep. Pt reports that she is compliant with  medication and has no significant side effects.   Allowed pt to explore and express thoughts and feelings associated with recent life situations and external stressors. Pt is excited about upcoming holidays and spending quality time with family.  Daughter is coming from [place] and will stay with other daughter and with pt. Explored pts relationship with sister and pt states about her sister "if it wasn't for her care, I probably wouldn't be alive right now". Reviewed strategies to help manage depression symptoms and stress/anxiety symptoms.   Continued recommendations are as follows: self care behaviors, positive social engagements, focusing on overall work/home/life balance, and focusing on positive physical and emotional wellness. .   Suicidal/Homicidal: No  Therapist Response: Pt is continuing to apply interventions learned in session into daily life situations. Pt is currently on track to meet goals utilizing interventions mentioned above. Personal growth and progress noted. Treatment to continue as indicated.   Plan: Return again PRN.  Pt will call back after next psychiatric appointment.  Diagnosis: Axis I: MDD, recurrent   Axis II: No diagnosis    Denise Knapp , LCSW 03/26/2021

## 2021-04-05 ENCOUNTER — Other Ambulatory Visit: Payer: Self-pay | Admitting: Psychiatry

## 2021-04-05 DIAGNOSIS — F3342 Major depressive disorder, recurrent, in full remission: Secondary | ICD-10-CM

## 2021-04-17 ENCOUNTER — Telehealth (INDEPENDENT_AMBULATORY_CARE_PROVIDER_SITE_OTHER): Payer: Self-pay | Admitting: Psychiatry

## 2021-04-17 ENCOUNTER — Telehealth: Payer: Self-pay | Admitting: Psychiatry

## 2021-04-17 ENCOUNTER — Other Ambulatory Visit: Payer: Self-pay

## 2021-04-17 DIAGNOSIS — Z91199 Patient's noncompliance with other medical treatment and regimen due to unspecified reason: Secondary | ICD-10-CM

## 2021-04-17 NOTE — Telephone Encounter (Signed)
Patient LVM after appt time that she received Dr. Jodie Echevaria Vms but did not hear the calls. Called patient back no answer.

## 2021-04-18 ENCOUNTER — Other Ambulatory Visit: Payer: Self-pay

## 2021-04-18 NOTE — Progress Notes (Signed)
Patient ID: Denise Knapp, female   DOB: [birth year], 66 y.o.   MRN: [id number] Attempted to contact patient twice by phone, had to leave a voicemail each time.  Patient however reached out to front desk after her appointment time was over.  Advised staff to reschedule this patient.

## 2021-05-02 ENCOUNTER — Telehealth (INDEPENDENT_AMBULATORY_CARE_PROVIDER_SITE_OTHER): Payer: Medicare PPO | Admitting: Psychiatry

## 2021-05-02 ENCOUNTER — Other Ambulatory Visit: Payer: Self-pay

## 2021-05-02 ENCOUNTER — Encounter: Payer: Self-pay | Admitting: Psychiatry

## 2021-05-02 DIAGNOSIS — Z79899 Other long term (current) drug therapy: Secondary | ICD-10-CM

## 2021-05-02 DIAGNOSIS — Z8659 Personal history of other mental and behavioral disorders: Secondary | ICD-10-CM

## 2021-05-02 DIAGNOSIS — F3342 Major depressive disorder, recurrent, in full remission: Secondary | ICD-10-CM

## 2021-05-02 DIAGNOSIS — F09 Unspecified mental disorder due to known physiological condition: Secondary | ICD-10-CM

## 2021-05-02 NOTE — Progress Notes (Signed)
Virtual Visit via Telephone Note  I connected with Denise Knapp on 05/02/21 at  4:20 PM EST by telephone and verified that I am speaking with the correct person using two identifiers.  Location Provider Location : [facility] Patient Location : Home  Participants: Patient , Provider    I discussed the limitations, risks, security and privacy concerns of performing an evaluation and management service by telephone and the availability of in person appointments. I also discussed with the patient that there may be a patient responsible charge related to this service. The patient expressed understanding and agreed to proceed.    I discussed the assessment and treatment plan with the patient. The patient was provided an opportunity to ask questions and all were answered. The patient agreed with the plan and demonstrated an understanding of the instructions.   The patient was advised to call back or seek an in-person evaluation if the symptoms worsen or if the condition fails to improve as anticipated.   [facility] MD OP Progress Note  05/02/2021 4:33 PM Ruby Logiudice  MRN:  [id number]  Chief Complaint:  Chief Complaint   Follow-up; Anxiety    HPI: Denise Knapp is a 66 year old Caucasian female, retired, lives in [place], has a history of MDD, obstructive sleep apnea, hypothyroidism, interstitial lung disease, diabetes mellitus, hyperlipidemia was evaluated by telemedicine today.  Patient appeared to be alert, oriented to person place time and situation.  Patient today reports she is overall doing fairly well.  Reports she had a good holiday season with her family.  Was able to spend time with her 2 daughters as well as grandchildren.  Patient reports overall mood symptoms are stable on the current medication regimen.  Denies any suicidality, homicidality or perceptual disturbances.  Reports sleep is overall okay.  Reviewed and discussed prolactin level which came back high.  Likely because  of the risperidone.  Agreeable to get a repeat level.  She agrees to go to [facility].  Denies any other concerns today.  Visit Diagnosis:    ICD-10-CM   1. MDD (major depressive disorder), recurrent, in full remission ([place])  F33.42 Prolactin    2. Mild cognitive disorder  F09     3. High risk medication use  Z79.899 Prolactin    4. History of agoraphobia  Z86.59     5. History of psychosis  Z86.59       Past Psychiatric History: I have reviewed past psychiatric history from progress note on 09/19/2019.  Past Medical History:  Past Medical History:  Diagnosis Date   Asthma    Diabetes mellitus without complication ([place])    History reviewed. No pertinent surgical history.  Family Psychiatric History: I have reviewed family psychiatric history from progress note on 09/19/2019.  Family History:  Family History  Problem Relation Age of Onset   Depression Mother    Mental illness Daughter    Mental illness Daughter     Social History: I have reviewed social history from progress note on 09/19/2019. 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 Yeast infection Yeast infection Pt reports no known drug allergies, but chart from [facility] shows Penicillins Pt reports no known drug allergies, but chart from [facility] shows Penicillins     Metabolic Disorder Labs: No results found for: HGBA1C, MPG Lab Results  Component Value Date   PROLACTIN 41.8 (H) 01/25/2021   Lab Results  Component Value Date   CHOL 201 (H) 01/25/2021   TRIG 224 (H) 01/25/2021   HDL 50 01/25/2021   CHOLHDL 4.0 01/25/2021   VLDL 45 (H) 01/25/2021   LDLCALC 106 (H) 01/25/2021   No results found for: TSH  Therapeutic Level Labs: No results found for: LITHIUM No results found for: VALPROATE No components found for:  CBMZ  Current Medications: Current Outpatient Medications  Medication Sig Dispense Refill   Cysteamine Bitartrate (PROCYSBI) 300 MG PACK Use to test blood sugar daily     metFORMIN (GLUCOPHAGE) 500 MG tablet Take 2 tablets by mouth daily.     albuterol (ACCUNEB) 1.25 MG/3ML nebulizer solution      alendronate (FOSAMAX) 35 MG tablet Take by mouth. (Patient not taking: Reported on 10/31/2020)     Apoaequorin 10 MG CAPS Take by mouth.     ascorbic acid (VITAMIN C) 100 MG tablet Take by mouth.     aspirin 81 MG EC tablet Take by mouth.     b complex vitamins tablet Take by mouth.     Blood Glucose Monitoring Suppl (FIFTY50 GLUCOSE METER 2.0) w/Device KIT Use as instructed     Blood Glucose Monitoring Suppl (ONETOUCH VERIO) w/Device KIT Use to test blood sugar     budesonide (PULMICORT) 0.5 MG/2ML nebulizer solution      budesonide (PULMICORT) 0.5 MG/2ML nebulizer solution Inhale into the lungs.     buPROPion (WELLBUTRIN XL) 150 MG 24 hr tablet TAKE 2 TABLETS (300 MG TOTAL) BY MOUTH DAILY WITH  BREAKFAST. 180 tablet 1   Calcium Carbonate-Vitamin D 600-200 MG-UNIT TABS Take by mouth.     Cholecalciferol 25 MCG (1000 UT) capsule Take by mouth.     dapagliflozin propanediol (FARXIGA) 10 MG TABS tablet Take 1 tablet by mouth daily.     donepezil (ARICEPT) 5 MG tablet Take by mouth.     Dulaglutide 1.5 MG/0.5ML SOPN Inject into the skin.     enalapril (VASOTEC) 2.5 MG tablet Take 2.5 mg by mouth daily.     enalapril (VASOTEC) 2.5 MG tablet Take 1 tablet by mouth daily.     ESBRIET 267 MG TABS      famotidine (PEPCID) 40 MG tablet Take 40 mg by mouth daily.     fluconazole (DIFLUCAN) 100 MG tablet Take by mouth. (Patient not taking: Reported  on 10/31/2020)     FLUoxetine (PROZAC) 40 MG capsule TAKE 1 CAPSULE BY MOUTH EVERY DAY 90 capsule 0   fluticasone (FLONASE) 50 MCG/ACT nasal spray Place into the nose.     glucose blood (PRECISION QID TEST) test strip by Other route 2 (two) times a day with meals. Please dispense 50 ultra fine lancets     guaifenesin (ROBITUSSIN) 100 MG/5ML syrup Take by mouth. (Patient not taking: Reported on 10/31/2020)     ibuprofen (ADVIL) 200 MG tablet Take by mouth.     levothyroxine (SYNTHROID) 137 MCG tablet Take by mouth.     levothyroxine (SYNTHROID) 137 MCG tablet Take by mouth.     loperamide (IMODIUM) 2 MG capsule Take by mouth.     lovastatin (MEVACOR) 40 MG tablet Take 40 mg by mouth daily.     meclizine (ANTIVERT) 25 MG tablet Take by mouth.     metFORMIN (GLUCOPHAGE) 500 MG tablet TAKE 2 TABLETS BY MOUTH IN THE MORNING AND 1 TABLET IN THE EVENING     Multiple Vitamin (MULTI-VITAMIN) tablet Take by mouth.     mycophenolate (CELLCEPT) 500 MG tablet TAKE 2 TABLETS BY MOUTH 2 TIMES DAILY     OFEV 150 MG CAPS      omeprazole (PRILOSEC) 20 MG capsule Take by mouth.     OneTouch [employer]ica Lancets 77L MISC      ONETOUCH ULTRA test strip      ONETOUCH VERIO test strip 1 each daily.     Pirfenidone (ESBRIET) 267 MG TABS Take by mouth.     predniSONE  (DELTASONE) 10 MG tablet      risperiDONE (RISPERDAL) 2 MG tablet Take 1 tablet (2 mg total) by mouth at bedtime. 90 tablet 0   Sod Fluoride-Potassium Nitrate 1.1-5 % PSTE See admin instructions.     sodium chloride (OCEAN) 0.65 % nasal spray Place into the nose.     sulfamethoxazole-trimethoprim (BACTRIM DS) 800-160 MG tablet Take by mouth.     traMADol (ULTRAM) 50 MG tablet Take 50 mg by mouth every 6 (six) hours as needed. (Patient not taking: Reported on 3/90/3009)     TRULICITY 1.5 QZ/3.0QT SOPN SMARTSIG:0.5 Milliliter(s) SUB-Q Once a Week     Vitamin D, Ergocalciferol, (DRISDOL) 1.25 MG (50000 UNIT) CAPS capsule      No current facility-administered medications for this visit.     Musculoskeletal: Strength & Muscle Tone:  UTA Gait & Station:  UTA Patient leans:  UTA  Psychiatric Specialty Exam: Review of Systems  All other systems reviewed and are negative.  There were no vitals taken for this visit.There is no height or weight on file to calculate BMI.  General Appearance:  UTA  Eye Contact:   UTA  Speech:  Clear and Coherent  Volume:  Normal  Mood:  Euthymic  Affect:   UTA  Thought Process:  Goal Directed and Descriptions of Associations: Intact  Orientation:  Full (Time, Place, and Person)  Thought Content: Logical   Suicidal Thoughts:  No  Homicidal Thoughts:  No  Memory:  Immediate;   Fair Recent;   Fair Remote;   Limited  Judgement:  Fair  Insight:  Fair  Psychomotor Activity:  Normal  Concentration:  Concentration: Fair and Attention Span: Fair  Recall:  [employer] of Knowledge: Fair  Language: Fair  Akathisia:  No  Handed:  Right  AIMS (if indicated): not done  Assets:  Communication Skills Desire for Improvement Housing Social Support  ADL's:  Intact  Cognition: WNL  Sleep:  Fair   Screenings: GAD-7    Flowsheet Row Video Visit from 01/15/2021 in [facility] from 11/28/2019 in [facility]  Total GAD-7 Score 5 13      PHQ2-9    Flowsheet Row Counselor from 03/26/2021 in [facility] from 01/24/2021 in [facility] Video Visit from 01/15/2021 in [facility] from 11/20/2020 in [facility] Video Visit from 10/30/2020 in [facility]  PHQ-2 Total Score 0 0 0 0 0  PHQ-9 Total Score -- -- 3 -- --      Flowsheet Row Counselor from 03/26/2021 in [facility] Counselor from 01/24/2021 in [facility] from 11/20/2020 in [facility] Low Risk Low Risk Low Risk        Assessment and Plan: Garnette Greb is a 66 year old Caucasian female, divorced, on SSD, lives in [place], has a history of MDD, interstitial lung disease, obstructive sleep apnea, morbid obesity was evaluated by telemedicine today.  Patient is currently stable however does have recent abnormal prolactin level, will benefit from the following plan.  Plan MDD in remission Prozac 40 mg p.o. daily Wellbutrin XL 300 mg p.o. daily Risperidone 2 mg p.o. nightly.  Cognitive disorder likely mild-stable Aricept as prescribed per neurology  High risk medication use-reviewed prolactin-elevated at 41.8, lipid panel elevated-abnormal. We will repeat prolactin level-patient advised to go to [facility] lab. Advised to follow up with primary care provider for abnormal lipid panel. We will consider reducing the dosage of risperidone as needed in the future.  Follow-up in clinic in 3 months or sooner if needed in person.   I have spent at least  18 minutes non face to face with patient today.  This note was generated in part or whole with voice recognition software. Voice recognition is usually quite accurate but there are transcription errors that can and  very often do occur. I apologize for any typographical errors that were not detected and corrected.       Ursula Alert, MD 05/03/2021, 1:51 PM

## 2021-05-07 ENCOUNTER — Telehealth: Payer: Self-pay

## 2021-05-07 NOTE — Telephone Encounter (Signed)
pt left a message that she been sick and that is why she has not had labwork done yet. as soon as she feels better she will go get it done.

## 2021-05-07 NOTE — Telephone Encounter (Signed)
NOTED

## 2021-06-27 ENCOUNTER — Other Ambulatory Visit: Payer: Self-pay | Admitting: Infectious Diseases

## 2021-07-04 ENCOUNTER — Other Ambulatory Visit: Payer: Self-pay | Admitting: Psychiatry

## 2021-07-04 DIAGNOSIS — F3342 Major depressive disorder, recurrent, in full remission: Secondary | ICD-10-CM

## 2021-07-12 ENCOUNTER — Telehealth: Payer: Self-pay

## 2021-07-12 ENCOUNTER — Other Ambulatory Visit: Payer: Self-pay | Admitting: Psychiatry

## 2021-07-12 DIAGNOSIS — F3342 Major depressive disorder, recurrent, in full remission: Secondary | ICD-10-CM

## 2021-07-12 MED ORDER — RISPERIDONE 2 MG PO TABS: 2.0000 mg | ORAL_TABLET | Freq: Every day | ORAL | 0 refills | Status: DC

## 2021-07-12 NOTE — Telephone Encounter (Signed)
pt needs refills on the [facility]perdohje [facility] ?

## 2021-07-12 NOTE — Telephone Encounter (Signed)
Please let me know [facility] local [facility] or [facility] order.

## 2021-07-12 NOTE — Telephone Encounter (Signed)
Ordered

## 2021-07-30 ENCOUNTER — Telehealth (INDEPENDENT_AMBULATORY_CARE_PROVIDER_SITE_OTHER): Payer: Medicare PPO | Admitting: Psychiatry

## 2021-07-30 ENCOUNTER — Encounter: Payer: Self-pay | Admitting: Psychiatry

## 2021-07-30 DIAGNOSIS — Z8659 Personal history of other mental and behavioral disorders: Secondary | ICD-10-CM

## 2021-07-30 DIAGNOSIS — F09 Unspecified mental disorder due to known physiological condition: Secondary | ICD-10-CM

## 2021-07-30 DIAGNOSIS — Z79899 Other long term (current) drug therapy: Secondary | ICD-10-CM

## 2021-07-30 DIAGNOSIS — F3342 Major depressive disorder, recurrent, in full remission: Secondary | ICD-10-CM

## 2021-07-30 MED ORDER — RISPERIDONE 2 MG PO TABS: 2.0000 mg | ORAL_TABLET | Freq: Every day | ORAL | 0 refills | Status: DC

## 2021-07-30 MED ORDER — FLUOXETINE HCL 40 MG PO CAPS: 40.0000 mg | ORAL_CAPSULE | Freq: Every day | ORAL | 0 refills | Status: DC

## 2021-07-30 NOTE — Progress Notes (Signed)
Virtual Visit via Telephone Note ? ?I connected with Denise Knapp on 07/30/21 at 10:30 AM EDT by telephone and verified that I am speaking with the correct person using two identifiers. ? ?Location ?Provider Location : [facility] ?Patient Location : Home ? ?Participants: Patient , Provider ? ?  ?I discussed the limitations, risks, security and privacy concerns of performing an evaluation and management service by telephone and the availability of in person appointments. I also discussed with the patient that there may be a patient responsible charge related to this service. The patient expressed understanding and agreed to proceed. ? ?  ?I discussed the assessment and treatment plan with the patient. The patient was provided an opportunity to ask questions and all were answered. The patient agreed with the plan and demonstrated an understanding of the instructions. ?  ?The patient was advised to call back or seek an in-person evaluation if the symptoms worsen or if the condition fails to improve as anticipated. ? ? ? ?[facility] MD OP Progress Note ? ?07/30/2021 11:09 AM ?Denise Knapp  ?MRN:  [id number] ? ?Chief Complaint:  ?Chief Complaint  ?Patient presents with  ? Follow-up : 66 year old Caucasian female with history of MDD, obstructive sleep apnea, multiple other medical problems, presented for medication management.  ? ?HPI: Denise Knapp is a 66 year old Caucasian female, retired, lives in [place], on [place], has a history of MDD, obstructive sleep apnea, hypothyroidism, interstitial lung disease, diabetes mellitus, hyperlipidemia was evaluated by phone today. ? ?Patient was supposed to come into the office for an office visit however she contacted the office yesterday wanting a virtual visit.  However today patient did not connect by video and after multiple attempts to connect, patient was able to be contacted by phone.  Patient eventually agreed to have a phone call. ? ?Patient today reports she has been struggling with chronic  lung problems, currently under the care of pulmonologist.  Per review of notes per pulmonology-Dr. Lanney Gins -patient with history of interstitial lung disease, episodes of non massive hemoptysis.  She remains on home oxygen.  Patient had recent appointment on 07/18/2021. ? ?Patient today appeared to be alert, oriented to person place time and situation. ? ?Patient reports mood wise she is doing okay, her anxiety and mood swings are manageable. ? ?Denies any suicidality, homicidality or perceptual disturbances. ? ?Currently compliant on medications.  Denies side effects. ? ?Patient does report recent hemoptysis,agrees to get in touch with her primary care provider.  However based on notes per pulmonologist-likely chronic.  Patient also agrees to go to the emergency department if it gets worse. ? ? ? ?Visit Diagnosis:  ?  ICD-10-CM   ?1. MDD (major depressive disorder), recurrent, in full remission ([facility])  F33.42 risperiDONE (RISPERDAL) 2 MG tablet  ?  FLUoxetine (PROZAC) 40 MG capsule  ?  ?2. Mild cognitive disorder  F09   ?  ?3. High risk medication use  Z79.899   ?  ?4. History of agoraphobia  Z86.59   ?  ?5. History of psychosis  Z86.59   ?  ? ? ?Past Psychiatric History: Reviewed past psychiatric history from progress note on 09/19/2019. ? ?Past Medical History:  ?Past Medical History:  ?Diagnosis Date  ? Asthma   ? Diabetes mellitus without complication ([facility])   ? History reviewed. No pertinent surgical history. ? ?Family Psychiatric History: Reviewed family psychiatric history from progress note on 09/19/2019. ? ?Family History:  ?Family History  ?Problem Relation Age of Onset  ? Depression Mother   ?  Mental illness Daughter   ? Mental illness Daughter   ? ? ?Social History: Reviewed social history from progress note on 09/19/2019. ?Social History  ? ?Socioeconomic History  ? Marital status: Legally Separated  ?  Spouse name: Not on file  ? Number of children: 2  ? Years of education: Not on file  ? Highest education  level: Not on file  ?Occupational History  ? Occupation: retired  ?Tobacco Use  ? Smoking status: Never  ? Smokeless tobacco: Never  ?Substance and Sexual Activity  ? Alcohol use: Never  ? Drug use: Never  ? Sexual activity: Not Currently  ?Other Topics Concern  ? Not on file  ?Social History Narrative  ? Completed bachelors degree, [employer]  ? 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 On SSD  ? ?Social Determinants of Health  ? ?Financial Resource Strain: Not on file  ?Food Insecurity: Not on file  ?Transportation Needs: Not on file  ?Physical Activity: Not on file  ?Stress: Not on file  ?Social Connections: Not on file  ? ? ?Allergies:  ?Allergies  ?Allergen Reactions  ? Nintedanib Diarrhea and Nausea And Vomiting  ?  Per pt report  ? Penicillins Itching and Other (See Comments)  ?  Yeast infection ?Pt reports no known drug allergies, but chart from [facility] shows Penicillins ?Reports hx of yeast infection during course of therapy. ?Yeast infection ?Yeast infection ?Pt reports no known drug allergies, but chart from [facility] shows Penicillins ?Pt reports no known drug allergies, but chart from [facility] shows Penicillins ?  ? ? ?Metabolic Disorder Labs: ?No results found for: HGBA1C, MPG ?Lab Results  ?Component Value Date  ? PROLACTIN 41.8 (H) 01/25/2021  ? ?Lab Results  ?Component Value Date  ? CHOL 201 (H) 01/25/2021  ? TRIG 224 (H) 01/25/2021  ? HDL 50 01/25/2021  ? CHOLHDL 4.0 01/25/2021  ? VLDL 45 (H) 01/25/2021  ? LDLCALC 106 (H) 01/25/2021  ? ?No results found for: TSH ? ?Therapeutic Level Labs: ?No results found for: LITHIUM ?No results found for: VALPROATE ?No components found for:  CBMZ ? ?Current Medications: ?Current Outpatient Medications  ?Medication Sig Dispense Refill  ? albuterol (ACCUNEB) 1.25 MG/3ML nebulizer solution     ? alendronate (FOSAMAX) 35 MG tablet Take by mouth. (Patient not taking: Reported on 10/31/2020)    ? Apoaequorin 10 MG CAPS Take by mouth.    ? ascorbic acid  (VITAMIN C) 100 MG tablet Take by mouth.    ? aspirin 81 MG EC tablet Take by mouth.    ? b complex vitamins tablet Take by mouth.    ? Blood Glucose Monitoring Suppl (FIFTY50 GLUCOSE METER 2.0) w/Device KIT Use as instructed    ? Blood Glucose Monitoring Suppl (ONETOUCH VERIO) w/Device KIT Use to test blood sugar    ? budesonide (PULMICORT) 0.5 MG/2ML nebulizer solution     ? budesonide (PULMICORT) 0.5 MG/2ML nebulizer solution Inhale into the lungs.    ? buPROPion (WELLBUTRIN XL) 150 MG 24 hr tablet TAKE 2 TABLETS (300 MG TOTAL) BY MOUTH DAILY WITH BREAKFAST. 180 tablet 1  ? Calcium Carbonate-Vitamin D 600-200 MG-UNIT TABS Take by mouth.    ? Cholecalciferol 25 MCG (1000 UT) capsule Take by mouth.    ? Cysteamine Bitartrate (PROCYSBI) 300 MG PACK Use to test blood sugar daily    ? dapagliflozin propanediol (FARXIGA) 10 MG TABS tablet Take 1 tablet by mouth daily.    ?  donepezil (ARICEPT) 5 MG tablet Take by mouth.    ? Dulaglutide 1.5 MG/0.5ML SOPN Inject into the skin.    ? enalapril (VASOTEC) 2.5 MG tablet Take 2.5 mg by mouth daily.    ? enalapril (VASOTEC) 2.5 MG tablet Take 1 tablet by mouth daily.    ? ESBRIET 267 MG TABS     ? famotidine (PEPCID) 40 MG tablet Take 40 mg by mouth daily.    ? fluconazole (DIFLUCAN) 100 MG tablet Take by mouth. (Patient not taking: Reported on 10/31/2020)    ? [START ON 10/02/2021] FLUoxetine (PROZAC) 40 MG capsule Take 1 capsule (40 mg total) by mouth daily. 90 capsule 0  ? fluticasone (FLONASE) 50 MCG/ACT nasal spray Place into the nose.    ? glucose blood (PRECISION QID TEST) test strip by Other route 2 (two) times a day with meals. Please dispense 50 ultra fine lancets    ? guaifenesin (ROBITUSSIN) 100 MG/5ML syrup Take by mouth. (Patient not taking: Reported on 10/31/2020)    ? ibuprofen (ADVIL) 200 MG tablet Take by mouth.    ? levothyroxine (SYNTHROID) 137 MCG tablet Take by mouth.    ? levothyroxine (SYNTHROID) 137 MCG tablet Take by mouth.    ? loperamide (IMODIUM) 2 MG  capsule Take by mouth.    ? lovastatin (MEVACOR) 40 MG tablet Take 40 mg by mouth daily.    ? meclizine (ANTIVERT) 25 MG tablet Take by mouth.    ? metFORMIN (GLUCOPHAGE) 500 MG tablet TAKE 2 TABLETS BY M

## 2021-08-05 ENCOUNTER — Ambulatory Visit
Admission: RE | Admit: 2021-08-05 | Discharge: 2021-08-05 | Disposition: A | Discharge status: Home / Self Care | Payer: Medicare PPO | Source: Ambulatory Visit | Attending: Infectious Diseases | Admitting: Infectious Diseases

## 2021-08-05 DIAGNOSIS — Z1231 Encounter for screening mammogram for malignant neoplasm of breast: Secondary | ICD-10-CM | POA: Insufficient documentation

## 2021-08-16 ENCOUNTER — Other Ambulatory Visit: Payer: Self-pay | Admitting: *Deleted

## 2021-08-16 ENCOUNTER — Inpatient Hospital Stay
Admission: RE | Admit: 2021-08-16 | Discharge: 2021-08-16 | Disposition: A | Discharge status: Home / Self Care | Payer: Self-pay | Source: Ambulatory Visit | Attending: *Deleted | Admitting: *Deleted

## 2021-08-16 DIAGNOSIS — Z1231 Encounter for screening mammogram for malignant neoplasm of breast: Secondary | ICD-10-CM

## 2021-08-26 ENCOUNTER — Ambulatory Visit (INDEPENDENT_AMBULATORY_CARE_PROVIDER_SITE_OTHER): Payer: Medicare PPO | Admitting: Licensed Clinical Social Worker

## 2021-08-26 DIAGNOSIS — F3342 Major depressive disorder, recurrent, in full remission: Secondary | ICD-10-CM | POA: Diagnosis not present

## 2021-08-26 NOTE — Progress Notes (Signed)
Virtual Visit via Audio Note ? ?I connected with Denise Knapp on 08/26/21 at  9:00 AM EDT by an audio enabled telemedicine application and verified that I am speaking with the correct person using two identifiers. ? ?Location: ?Patient: home ?Provider: remote office Denise Knapp, [name] ?  ?I discussed the limitations of evaluation and management by telemedicine and the availability of in person appointments. The patient expressed understanding and agreed to proceed. ? ?  ?I discussed the assessment and treatment plan with the patient. The patient was provided an opportunity to ask questions and all were answered. The patient agreed with the plan and demonstrated an understanding of the instructions. ?  ?The patient was advised to call back or seek an in-person evaluation if the symptoms worsen or if the condition fails to improve as anticipated. ? ?I provided  30  minutes of non-face-to-face time during this encounter. ? ? ? R , LCSW ? ? ?THERAPIST PROGRESS NOTE ? ?Session Time: 8-830a ? ?Participation Level: Active ? ?Behavioral Response: NAAlertEuthymic ? ?Type of Therapy: Individual Therapy ? ?ProgressTowards Goals: Progressing ? ?Treatment Goals addressed:  ?Problem: Decrease depressive symptoms and improve levels of effective functioning ? ?Goal: LTG: Reduce frequency, intensity, and duration of depression symptoms as evidenced by: pt self report ?Outcome: Progressing ? ?Goal: STG: Denise Knapp WILL PARTICIPATE IN AT LEAST 80% OF SCHEDULED INDIVIDUAL PSYCHOTHERAPY SESSIONS ?Description: Pt is on time for appt ?Outcome: Progressing ? ?Intervention: Encourage self-care activities ?Note: Reviewed ? ?Intervention: REVIEW PLEASE SKILLS (TREAT PHYSICAL ILLNESS, BALANCE EATING, AVOID MOOD-ALTERING SUBSTANCES, BALANCE SLEEP AND GET EXERCISE) WITH [place]ia ?Note: reviewed ? ?Intervention: Encourage patient to set small goals for self ?Note: reviewed ? ?Interventions: CBT and supportive ? ? ?Summary: Denise Knapp is a 66  y.o. female who presents with improving symptoms related to depression diagnosis. Pt reports stable mood and good quality/quantity of sleep. Pt reports that she continues to be compliant with medication and has no significant side effects.  ? ?Allowed pt to explore and express thoughts and feelings associated with recent life situations and external stressors.  ? ?Reviewed strategies to help manage depression symptoms and stress/anxiety symptoms. Patient reports that she recently spoke with both daughters for Mother's Day weekend. Allowed patient to explore her relationships with her daughters, and how this continues to be a very precious thing for her period patient did report that there are other family members that try to come in between her relationship with her children. Patient feels that these family members say negative things about her to her children, attempting to damage their relationship. 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 Patient reports again that she fears that her children do not have enough food to eat. When asked to elaborate on this, patient states that her children were often made to fast when they were younger, and patient does not agree with this. Patient states that she is fearful that her children are continuing to fast at times. Allowed patient to understand the difference in not having enough food to eat, and choosing when and what to eat. Patient reports she does not feel that her daughters have any eating disorders. Allowed patient to explore relationship with her grandsons. Patient is very happy with her relationship. ? ?Explored relationship with patients mother--patient states that things are still really good, and her mother is 63 years old. Patient reports that she said that she  did not go and see her, but patient is not feeling well physically at this  time. ? ?Continued recommendations are as follows: self care behaviors, positive social engagements, focusing on overall work/home/life balance, and focusing on positive physical and emotional wellness. .  ? ?Suicidal/Homicidal: No ? ?Therapist Response: Pt is continuing to apply interventions learned in session into daily life situations. Pt is currently on track to meet goals utilizing interventions mentioned above. Personal growth and progress noted. Treatment to continue as indicated.  ? ?Plan: Return again PRN.  Pt will call back to schedule next counseling appointment. Added current interventions to tx plan ? ?Diagnosis:  ? ?Encounter Diagnosis  ?Name Primary?  ? MDD (major depressive disorder), recurrent, in full remission (HCC) Yes  ? ? ?Collaboration of Care: Other pt encouraged to continue care with psychiatrist of record, Dr. Jomarie Longs ? ?Patient/Guardian was advised Release of Information must be obtained prior to any record release in order to collaborate their care with an outside provider. Patient/Guardian was advised if they have not already done so to contact the registration department to sign all necessary forms in order for [place] to release information regarding their care.  ? ?Consent: Patient/Guardian gives verbal consent for treatment and assignment of benefits for services provided during this visit. Patient/Guardian expressed understanding and agreed to proceed.  ? ? ? ? ? R , LCSW ?08/26/2021 ? ?

## 2021-08-26 NOTE — Plan of Care (Signed)
?  Problem: Decrease depressive symptoms and improve levels of effective functioning ?Goal: LTG: Reduce frequency, intensity, and duration of depression symptoms as evidenced by: pt self report ?Outcome: Progressing ?Goal: STG: [name] WILL PARTICIPATE IN AT LEAST 80% OF SCHEDULED INDIVIDUAL PSYCHOTHERAPY SESSIONS ?Description: Pt is on time for appt ?Outcome: Progressing ?Intervention: Encourage self-care activities ?Note: Reviewed ?Intervention: REVIEW PLEASE SKILLS (TREAT PHYSICAL ILLNESS, BALANCE EATING, AVOID MOOD-ALTERING SUBSTANCES, BALANCE SLEEP AND GET EXERCISE) WITH Alazia ?Note: reviewed ?Intervention: Encourage patient to set small goals for self ?Note: reviewed ?  ?

## 2021-08-30 ENCOUNTER — Other Ambulatory Visit: Payer: Self-pay | Admitting: Psychiatry

## 2021-08-30 DIAGNOSIS — F3342 Major depressive disorder, recurrent, in full remission: Secondary | ICD-10-CM

## 2021-09-28 ENCOUNTER — Other Ambulatory Visit: Payer: Self-pay | Admitting: Psychiatry

## 2021-09-28 DIAGNOSIS — F3342 Major depressive disorder, recurrent, in full remission: Secondary | ICD-10-CM

## 2021-10-29 ENCOUNTER — Ambulatory Visit: Payer: Medicare PPO | Admitting: Psychiatry

## 2021-11-27 ENCOUNTER — Telehealth (INDEPENDENT_AMBULATORY_CARE_PROVIDER_SITE_OTHER): Payer: Medicare PPO | Admitting: Psychiatry

## 2021-11-27 ENCOUNTER — Encounter: Payer: Self-pay | Admitting: Psychiatry

## 2021-11-27 DIAGNOSIS — F3342 Major depressive disorder, recurrent, in full remission: Secondary | ICD-10-CM

## 2021-11-27 DIAGNOSIS — F09 Unspecified mental disorder due to known physiological condition: Secondary | ICD-10-CM

## 2021-11-27 DIAGNOSIS — Z79899 Other long term (current) drug therapy: Secondary | ICD-10-CM

## 2021-11-27 DIAGNOSIS — Z634 Disappearance and death of family member: Secondary | ICD-10-CM

## 2021-11-27 DIAGNOSIS — Z9189 Other specified personal risk factors, not elsewhere classified: Secondary | ICD-10-CM

## 2021-11-27 MED ORDER — RISPERIDONE 2 MG PO TABS: 2.0000 mg | ORAL_TABLET | Freq: Every day | ORAL | 0 refills | Status: DC

## 2021-11-27 MED ORDER — FLUOXETINE HCL 40 MG PO CAPS: 40.0000 mg | ORAL_CAPSULE | Freq: Every day | ORAL | 0 refills | Status: DC

## 2021-11-27 NOTE — Progress Notes (Unsigned)
Virtual Visit via Telephone Note  I connected with Denise Knapp on 11/27/21 at  2:30 PM EDT by telephone and verified that I am speaking with the correct person using two identifiers.  Location Provider Location : [facility] Patient Location : Home  Participants: Patient , Provider   I discussed the limitations, risks, security and privacy concerns of performing an evaluation and management service by telephone and the availability of in person appointments. I also discussed with the patient that there may be a patient responsible charge related to this service. The patient expressed understanding and agreed to proceed   I discussed the assessment and treatment plan with the patient. The patient was provided an opportunity to ask questions and all were answered. The patient agreed with the plan and demonstrated an understanding of the instructions.   The patient was advised to call back or seek an in-person evaluation if the symptoms worsen or if the condition fails to improve as anticipated.    [facility] MD OP Progress Note  11/28/2021 9:05 AM Denise Knapp  MRN:  [id number]  Chief Complaint:  Chief Complaint  Patient presents with   Follow-up: 66 year old Caucasian female with history of depression, presented for medication management.   HPI: Denise Knapp is a 66 year old Caucasian female, retired, lives in [place], on [place], has a history of MDD, obstructive sleep apnea, hypothyroidism, interstitial lung disease, diabetes mellitus, hyperlipidemia was evaluated by phone today.  Patient was supposed to come into the office for an in person visit.  Patient however contacted and changed it to a virtual this morning.  Patient however at the time of the appointment reported that she could not connect by video and wanted to connect by phone.This appointment is currently limited because it is just a phone call .  Patient reports she is currently being the loss of her mother who passed away last month.   Patient has not had psychotherapy sessions in several months.  Reports she does have good support system from her family.  She does feel sad often and misses her mom.  Patient reports otherwise she has been doing fairly well, denies any significant depression symptoms.  Reports sleep is overall okay.  Denies any anxiety attacks or panic attacks.  Denies suicidality, homicidality or perceptual disturbances.  Reports she is compliant on the risperidone, fluoxetine, Wellbutrin.  Denies side effects.  Patient has been noncompliant with labs-prolactin level.  Patient denies any other concerns today.   Visit Diagnosis:    ICD-10-CM   1. MDD (major depressive disorder), recurrent, in full remission ([facility])  F33.42 FLUoxetine (PROZAC) 40 MG capsule    risperiDONE (RISPERDAL) 2 MG tablet    EKG 12-Lead    2. Mild cognitive disorder  F09     3. Bereavement  Z63.4     4. High risk medication use  Z79.899 Prolactin    5. At risk for prolonged QT interval syndrome  Z91.89 EKG 12-Lead      Past Psychiatric History: Reviewed past psychiatric history from progress note on 09/19/2019.  Past Medical History:  Past Medical History:  Diagnosis Date   Asthma    Diabetes mellitus without complication ([facility])    History reviewed. No pertinent surgical history.  Family Psychiatric History: Reviewed family psychiatric history from progress note on 09/19/2019.  Family History:  Family History  Problem Relation Age of Onset   Depression Mother    Mental illness Daughter    Mental illness Daughter    Breast cancer Paternal Aunt 47  Social History: Reviewed social history from progress note on 09/19/2019. Social History   Socioeconomic History   Marital status: Legally Separated    Spouse name: Not on file   Number of children: 2   Years of education: Not on file   Highest education level: Not on file  Occupational History   Occupation: retired  Tobacco Use   Smoking status: Never    Smokeless tobacco: Never  Substance and Sexual Activity   Alcohol use: Never   Drug use: Never   Sexual activity: Not Currently  Other Topics Concern   Not on file  Social History Narrative   Completed bachelors degree, Designer, jewellery   Worked for [employer] - for 30 years - SSD/[employer] Claims work   On [employer]   Social Determinants of Health   Financial Resource Strain: Medium Risk (11/28/2019)   Overall Financial Resource Strain (CARDIA)    Difficulty of Paying Living Expenses: Somewhat hard  Food Insecurity: Not on file  Transportation Needs: Not on file  Physical Activity: Not on file  Stress: Not on file  Social Connections: Not on file    Allergies:  Allergies  Allergen Reactions   Nintedanib Diarrhea and Nausea And Vomiting    Per pt report   Penicillins Itching and Other (See Comments)    Yeast infection Pt reports no known drug allergies, but chart from [facility] shows Penicillins Reports hx of yeast infection during course of therapy. Yeast infection Yeast infection Pt reports no known drug allergies, but chart from [facility] shows Penicillins Pt reports no known drug allergies, but chart from [facility] shows Penicillins     Metabolic Disorder Labs: No results found for: "HGBA1C", "MPG" Lab Results  Component Value Date   PROLACTIN 41.8 (H) 01/25/2021   Lab Results  Component Value Date   CHOL 201 (H) 01/25/2021   TRIG 224 (H) 01/25/2021   HDL 50 01/25/2021   CHOLHDL 4.0 01/25/2021   VLDL 45 (H) 01/25/2021   LDLCALC 106 (H) 01/25/2021   No results found for: "TSH"  Therapeutic Level Labs: No results found for: "LITHIUM" No results found for: "VALPROATE" No results found for: "CBMZ"  Current Medications: Current Outpatient Medications  Medication Sig Dispense Refill   albuterol (ACCUNEB) 1.25 MG/3ML nebulizer solution      alendronate (FOSAMAX) 35 MG tablet Take by mouth. (Patient not taking: Reported on 10/31/2020)     Apoaequorin 10 MG CAPS Take by mouth.      ascorbic acid (VITAMIN C) 100 MG tablet Take by mouth.     aspirin 81 MG EC tablet Take by mouth.     b complex vitamins tablet Take by mouth.     Blood Glucose Monitoring Suppl (FIFTY50 GLUCOSE METER 2.0) w/Device KIT Use as instructed     Blood Glucose Monitoring Suppl (ONETOUCH VERIO) w/Device KIT Use to test blood sugar     budesonide (PULMICORT) 0.5 MG/2ML nebulizer solution      budesonide (PULMICORT) 0.5 MG/2ML nebulizer solution Inhale into the lungs.     buPROPion (WELLBUTRIN XL) 150 MG 24 hr tablet TAKE 2 TABLETS (300 MG TOTAL) BY MOUTH DAILY WITH BREAKFAST. 180 tablet 1   Calcium Carbonate-Vitamin D 600-200 MG-UNIT TABS Take by mouth.     Cholecalciferol 25 MCG (1000 UT) capsule Take by mouth.     Cysteamine Bitartrate (PROCYSBI) 300 MG PACK Use to test blood sugar daily     donepezil (ARICEPT) 5 MG tablet Take by mouth.     Dulaglutide 1.5  MG/0.5ML SOPN Inject into the skin.     enalapril (VASOTEC) 2.5 MG tablet Take 2.5 mg by mouth daily.     enalapril (VASOTEC) 2.5 MG tablet Take 1 tablet by mouth daily.     ESBRIET 267 MG TABS      famotidine (PEPCID) 40 MG tablet Take 40 mg by mouth daily.     fluconazole (DIFLUCAN) 100 MG tablet Take by mouth. (Patient not taking: Reported on 10/31/2020)     FLUoxetine (PROZAC) 40 MG capsule Take 1 capsule (40 mg total) by mouth daily. 90 capsule 0   fluticasone (FLONASE) 50 MCG/ACT nasal spray Place into the nose.     glucose blood (PRECISION QID TEST) test strip by Other route 2 (two) times a day with meals. Please dispense 50 ultra fine lancets     guaifenesin (ROBITUSSIN) 100 MG/5ML syrup Take by mouth. (Patient not taking: Reported on 10/31/2020)     ibuprofen (ADVIL) 200 MG tablet Take by mouth.     levothyroxine (SYNTHROID) 137 MCG tablet Take by mouth.     levothyroxine (SYNTHROID) 137 MCG tablet Take by mouth.     loperamide (IMODIUM) 2 MG capsule Take by mouth.     lovastatin (MEVACOR) 40 MG tablet Take 40 mg by mouth daily.      meclizine (ANTIVERT) 25 MG tablet Take by mouth.     metFORMIN (GLUCOPHAGE) 500 MG tablet TAKE 2 TABLETS BY MOUTH IN THE MORNING AND 1 TABLET IN THE EVENING     metFORMIN (GLUCOPHAGE) 500 MG tablet Take 2 tablets by mouth daily.     Multiple Vitamin (MULTI-VITAMIN) tablet Take by mouth.     mycophenolate (CELLCEPT) 500 MG tablet TAKE 2 TABLETS BY MOUTH 2 TIMES DAILY     OFEV 150 MG CAPS      omeprazole (PRILOSEC) 20 MG capsule Take by mouth.     OneTouch Delica Lancets 34K MISC      ONETOUCH ULTRA test strip      ONETOUCH VERIO test strip 1 each daily.     Pirfenidone (ESBRIET) 267 MG TABS Take by mouth.     predniSONE (DELTASONE) 10 MG tablet      risperiDONE (RISPERDAL) 2 MG tablet Take 1 tablet (2 mg total) by mouth at bedtime. 90 tablet 0   Sod Fluoride-Potassium Nitrate 1.1-5 % PSTE See admin instructions.     sodium chloride (OCEAN) 0.65 % nasal spray Place into the nose.     sulfamethoxazole-trimethoprim (BACTRIM DS) 800-160 MG tablet Take 1 tablet by mouth 3 (three) times a week.     traMADol (ULTRAM) 50 MG tablet Take 50 mg by mouth every 6 (six) hours as needed. (Patient not taking: Reported on 8/76/8115)     TRULICITY 1.5 BW/6.2MB SOPN SMARTSIG:0.5 Milliliter(s) SUB-Q Once a Week     Vitamin D, Ergocalciferol, (DRISDOL) 1.25 MG (50000 UNIT) CAPS capsule      No current facility-administered medications for this visit.     Musculoskeletal: Strength & Muscle Tone:  UTA Gait & Station:  UTA Patient leans: N/A  Psychiatric Specialty Exam: Review of Systems  Psychiatric/Behavioral:         Grieving  All other systems reviewed and are negative.   There were no vitals taken for this visit.There is no height or weight on file to calculate BMI.  General Appearance:  UTA  Eye Contact:   UTA  Speech:  Normal Rate  Volume:  Normal  Mood:   Grieving  Affect:   UTA  Thought Process:  Goal Directed and Descriptions of Associations: Intact  Orientation:  Full (Time, Place, and  Person)  Thought Content: Logical   Suicidal Thoughts:  No  Homicidal Thoughts:  No  Memory:  Immediate;   Fair Recent;   Fair Remote;   Poor  Judgement:  Fair  Insight:  Shallow  Psychomotor Activity:   UTA  Concentration:  Concentration: Fair and Attention Span: Fair  Recall:  [employer] of Knowledge: Fair  Language: Fair  Akathisia:  No  Handed:  Right  AIMS (if indicated): not done  Assets:  Communication Skills Desire for Improvement Housing Social Support Transportation  ADL's:  Intact  Cognition: WNL  Sleep:  Fair   Screenings: GAD-7    Flowsheet Row Video Visit from 01/15/2021 in [facility] from 11/28/2019 in [facility]  Total GAD-7 Score 5 13      PHQ2-9    [facility] Video Visit from 11/27/2021 in [facility] from 03/26/2021 in [facility] from 01/24/2021 in [facility] Video Visit from 01/15/2021 in [facility] from 11/20/2020 in [facility]  PHQ-2 Total Score 0 0 0 0 0  PHQ-9 Total Score -- -- -- 3 --      Flowsheet Row Counselor from 03/26/2021 in [facility] from 01/24/2021 in [facility] from 11/20/2020 in [facility]        Assessment and Plan: Prim Morace is a 66 year old Caucasian female, divorced, on SSD, lives in [place], has a history of MDD, interstitial lung disease, obstructive sleep apnea, morbid obesity was evaluated by phone today.  Patient is currently stable on the current medication regimen.  Plan as noted below.  Plan MDD in remission Prozac 40 mg p.o. daily Wellbutrin XL 300 mg p.o. daily Risperidone 2 mg p.o. nightly  Cognitive disorder  likely mild-stable Continue neurology follow-ups.  Bereavement-unstable Encouraged to restart psychotherapy sessions. I have communicated with staff to schedule this patient with a therapist.  High risk medication use-prolactin level pending.  Will order again.  Encouraged compliance.  At risk for prolonged QT syndrome-we will order EKG since patient is on multiple medications including risperidone.  Provided phone #[phone].  Patient advised to come into the office for an in person visit next visit, this patient with multiple diagnoses, on polypharmacy, has never been seen for an in person visit and will need to be evaluated.  Patient scheduled multiple imports and visits in the past however would always change it to do virtual a phone call prior to the visit.   Follow-up in clinic in 3 to 4 months or sooner in person.   I have spent at least 16 minutes non face to face with patient today .     Collaboration of Care: Collaboration of Care: Referral or follow-up with counselor/therapist AEB encouraged to re establish care with a therapist and Other encouraged to get labs and EKG completed.  Patient/Guardian was advised Release of Information must be obtained prior to any record release in order to collaborate their care with an outside provider. Patient/Guardian was advised if they have not already done so to contact the registration department to sign all necessary forms in order for [place] to release information regarding their care.   Consent: Patient/Guardian gives verbal consent for treatment and assignment of benefits  for services provided during this visit. Patient/Guardian expressed understanding and agreed to proceed.   This note was generated in part or whole with voice recognition software. Voice recognition is usually quite accurate but there are transcription errors that can and very often do occur. I apologize for any typographical errors that were not detected and  corrected.      Ursula Alert, MD 11/28/2021, 9:05 AM

## 2021-12-03 ENCOUNTER — Telehealth: Payer: Medicare PPO | Admitting: Student

## 2021-12-03 DIAGNOSIS — Z515 Encounter for palliative care: Secondary | ICD-10-CM

## 2021-12-03 DIAGNOSIS — R531 Weakness: Secondary | ICD-10-CM

## 2021-12-03 DIAGNOSIS — R059 Cough, unspecified: Secondary | ICD-10-CM

## 2021-12-03 DIAGNOSIS — K219 Gastro-esophageal reflux disease without esophagitis: Secondary | ICD-10-CM

## 2021-12-03 DIAGNOSIS — Z79899 Other long term (current) drug therapy: Secondary | ICD-10-CM

## 2021-12-03 DIAGNOSIS — R0602 Shortness of breath: Secondary | ICD-10-CM

## 2021-12-03 DIAGNOSIS — F333 Major depressive disorder, recurrent, severe with psychotic symptoms: Secondary | ICD-10-CM

## 2021-12-03 NOTE — Progress Notes (Unsigned)
[facility] Consult Note Telephone: [phone]  Fax: [phone]   Date of encounter: 12/03/21 11:12 AM PATIENT NAME: Denise Knapp [address] [place] [address]   [phone] (home)  DOB: 27-Jul-1955 MRN: [id number] PRIMARY CARE PROVIDER:    Leonel Ramsay, MD,  [place] [address] [phone]  REFERRING PROVIDER:   Leonel Ramsay, MD [place],  [place] [address] [phone]  RESPONSIBLE PARTY:    Contact Information     Name Relation Home Work [place], Chrys Racer Daughter   [phone]   chelemer, Alain Marion   [phone]        I met face to face with patient and family in *** home/facility. Palliative Care was asked to follow this patient by consultation request of  Leonel Ramsay, MD to address advance care planning and complex medical decision making. This is the initial visit.                                     ASSESSMENT AND PLAN / RECOMMENDATIONS:   Advance Care Planning/Goals of Care: Goals include to maximize quality of life and symptom management. Patient/health care surrogate gave his/her permission to discuss.Our advance care planning conversation included a discussion about:    The value and importance of advance care planning  Experiences with loved ones who have been seriously ill or have died  Exploration of personal, cultural or spiritual beliefs that might influence medical decisions  Exploration of goals of care in the event of a sudden injury or illness  Identification  of a healthcare agent  Review and updating or creation of an  advance directive document . Decision not to resuscitate or to de-escalate disease focused treatments due to poor prognosis. CODE STATUS:  Symptom Management/Plan:    Follow up Palliative Care Visit: Palliative care will continue to follow for complex medical decision making, advance  care planning, and clarification of goals. Return *** weeks or prn.  I spent *** minutes providing this consultation. More than 50% of the time in this consultation was spent in counseling and care coordination.  This visit was coded based on medical decision making (MDM).***  PPS: ***0%  HOSPICE ELIGIBILITY/DIAGNOSIS: TBD  Chief Complaint: ***  HISTORY OF PRESENT ILLNESS:  Denise Knapp is a 66 y.o. year old female  with *** .   History obtained from review of EMR, discussion with primary team, and interview with family, facility staff/caregiver and/or Ms. [place].  I reviewed available labs, medications, imaging, studies and related documents from the EMR.  Records reviewed and summarized above.   ROS  *** General: NAD EYES: denies vision changes ENMT: denies dysphagia Cardiovascular: denies chest pain, denies DOE Pulmonary: denies cough, denies increased SOB Abdomen: endorses good appetite, denies constipation, endorses continence of bowel GU: denies dysuria, endorses continence of urine MSK:  denies increased weakness,  no falls reported Skin: denies rashes or wounds Neurological: denies pain, denies insomnia Psych: Endorses positive mood Heme/lymph/immuno: denies bruises, abnormal bleeding  Physical Exam: Current and past weights: Constitutional: NAD General: frail appearing, thin/WNWD/obese  EYES: anicteric sclera, lids intact, no discharge  ENMT: intact hearing, oral mucous membranes moist, dentition intact CV: S1S2, RRR, no LE edema Pulmonary: LCTA, no increased work of breathing, no cough, room air Abdomen: intake 100%, normo-active BS + 4 quadrants, soft and non tender,  no ascites GU: deferred MSK: no sarcopenia, moves all extremities, ambulatory Skin: warm and dry, no rashes or wounds on visible skin Neuro:  no generalized weakness,  no cognitive impairment Psych: non-anxious affect, A and O x 3 Hem/lymph/immuno: no widespread bruising CURRENT PROBLEM LIST:   Patient Active Problem List   Diagnosis Date Noted   Bereavement 11/27/2021   At risk for prolonged QT interval syndrome 11/27/2021   High risk medication use 01/15/2021   MDD (major depressive disorder), recurrent, in full remission ([place]) 05/16/2020   Loss of memory 03/16/2020   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) 09/16/2013   Screening for osteoporosis 01/13/2019   Depression, major, recurrent, severe with psychosis ([place]) 11/01/2015   Diabetes type 2, controlled ([place]) 12/06/2015   SOB (shortness of breath) 10/29/2015    Dyspnea 12/06/2015   Hashimoto's disease 10/08/2015   Hemoptysis 04/24/2018   High cholesterol 10/08/2015   Hyperlipidemia associated with type 2 diabetes mellitus ([place]) 10/08/2015   Pulmonary fibrosis, unspecified ([place]) 03/03/2014   Leukocytosis 12/06/2015   Morbid obesity ([place]) 10/07/2012   NSIP (nonspecific interstitial pneumonitis) ([place]) 11/20/2014   OSA (obstructive sleep apnea) 09/05/2015   Osteopenia of multiple sites 01/13/2019   Paranoia (psychosis) ([place]) 11/01/2015   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/19/2011   MDD (major depressive disorder), recurrent episode, mild ([place]) 09/19/2019   History of psychosis 09/19/2019   Mild cognitive disorder 11/10/2019   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 ([place]) 12/23/2019   Loss of memory 03/16/2020   MDD (major depressive disorder), recurrent, in full remission ([place]) 05/16/2020   High risk medication use 01/15/2021   Bereavement 11/27/2021   At risk for prolonged QT interval syndrome 11/27/2021   Resolved Ambulatory Problems    Diagnosis Date Noted   No Resolved Ambulatory Problems   Past Medical History:  Diagnosis Date   Asthma    Diabetes mellitus without complication ([place])    SOCIAL HX:  Social History   Tobacco Use   Smoking status: Never   Smokeless tobacco: Never  Substance Use Topics   Alcohol use: Never   FAMILY HX:  Family History  Problem Relation Age of Onset   Depression Mother    Mental illness Daughter  Mental illness Daughter    Breast cancer Paternal Aunt 54      ALLERGIES:  Allergies  Allergen Reactions   Nintedanib Diarrhea and Nausea And Vomiting    Per pt report   Penicillins Itching and Other (See Comments)    Yeast infection Pt reports no known drug allergies, but chart from [facility] shows Penicillins Reports hx of yeast infection during course of therapy. Yeast infection Yeast infection Pt reports no known drug allergies, but chart from [facility] shows  Penicillins Pt reports no known drug allergies, but chart from [facility] shows Penicillins      PERTINENT MEDICATIONS:  Outpatient Encounter Medications as of 12/03/2021  Medication Sig   albuterol (ACCUNEB) 1.25 MG/3ML nebulizer solution    alendronate (FOSAMAX) 35 MG tablet Take by mouth. (Patient not taking: Reported on 10/31/2020)   Apoaequorin 10 MG CAPS Take by mouth.   ascorbic acid (VITAMIN C) 100 MG tablet Take by mouth.   aspirin 81 MG EC tablet Take by mouth.   b complex vitamins tablet Take by mouth.   Blood Glucose Monitoring Suppl (FIFTY50 GLUCOSE METER 2.0) w/Device KIT Use as instructed   Blood Glucose Monitoring Suppl (ONETOUCH VERIO) w/Device KIT Use to test blood sugar   budesonide (PULMICORT) 0.5 MG/2ML nebulizer solution    budesonide (PULMICORT) 0.5 MG/2ML nebulizer solution Inhale into the lungs.   buPROPion (WELLBUTRIN XL) 150 MG 24 hr tablet TAKE 2 TABLETS (300 MG TOTAL) BY MOUTH DAILY WITH BREAKFAST.   Calcium Carbonate-Vitamin D 600-200 MG-UNIT TABS Take by mouth.   Cholecalciferol 25 MCG (1000 UT) capsule Take by mouth.   Cysteamine Bitartrate (PROCYSBI) 300 MG PACK Use to test blood sugar daily   donepezil (ARICEPT) 5 MG tablet Take by mouth.   Dulaglutide 1.5 MG/0.5ML SOPN Inject into the skin.   enalapril (VASOTEC) 2.5 MG tablet Take 2.5 mg by mouth daily.   enalapril (VASOTEC) 2.5 MG tablet Take 1 tablet by mouth daily.   ESBRIET 267 MG TABS    famotidine (PEPCID) 40 MG tablet Take 40 mg by mouth daily.   fluconazole (DIFLUCAN) 100 MG tablet Take by mouth. (Patient not taking: Reported on 10/31/2020)   FLUoxetine (PROZAC) 40 MG capsule Take 1 capsule (40 mg total) by mouth daily.   fluticasone (FLONASE) 50 MCG/ACT nasal spray Place into the nose.   glucose blood (PRECISION QID TEST) test strip by Other route 2 (two) times a day with meals. Please dispense 50 ultra fine lancets   guaifenesin (ROBITUSSIN) 100 MG/5ML syrup Take by mouth. (Patient not taking:  Reported on 10/31/2020)   ibuprofen (ADVIL) 200 MG tablet Take by mouth.   levothyroxine (SYNTHROID) 137 MCG tablet Take by mouth.   levothyroxine (SYNTHROID) 137 MCG tablet Take by mouth.   loperamide (IMODIUM) 2 MG capsule Take by mouth.   lovastatin (MEVACOR) 40 MG tablet Take 40 mg by mouth daily.   meclizine (ANTIVERT) 25 MG tablet Take by mouth.   metFORMIN (GLUCOPHAGE) 500 MG tablet TAKE 2 TABLETS BY MOUTH IN THE MORNING AND 1 TABLET IN THE EVENING   metFORMIN (GLUCOPHAGE) 500 MG tablet Take 2 tablets by mouth daily.   Multiple Vitamin (MULTI-VITAMIN) tablet Take by mouth.   mycophenolate (CELLCEPT) 500 MG tablet TAKE 2 TABLETS BY MOUTH 2 TIMES DAILY   OFEV 150 MG CAPS    omeprazole (PRILOSEC) 20 MG capsule Take by mouth.   OneTouch Delica Lancets 46O MISC    ONETOUCH ULTRA test strip    ONETOUCH VERIO test strip  1 each daily.   Pirfenidone (ESBRIET) 267 MG TABS Take by mouth.   predniSONE (DELTASONE) 10 MG tablet    risperiDONE (RISPERDAL) 2 MG tablet Take 1 tablet (2 mg total) by mouth at bedtime.   Sod Fluoride-Potassium Nitrate 1.1-5 % PSTE See admin instructions.   sodium chloride (OCEAN) 0.65 % nasal spray Place into the nose.   sulfamethoxazole-trimethoprim (BACTRIM DS) 800-160 MG tablet Take 1 tablet by mouth 3 (three) times a week.   traMADol (ULTRAM) 50 MG tablet Take 50 mg by mouth every 6 (six) hours as needed. (Patient not taking: Reported on 5/99/6895)   TRULICITY 1.5 LI/2.2YU SOPN SMARTSIG:0.5 Milliliter(s) SUB-Q Once a Week   Vitamin D, Ergocalciferol, (DRISDOL) 1.25 MG (50000 UNIT) CAPS capsule    No facility-administered encounter medications on file as of 12/03/2021.   Thank you for the opportunity to participate in the care of Ms. Inthavong.  The palliative care team will continue to follow. Please call our office at [phone] if we can be of additional assistance.   Ezekiel Slocumb, NP ,   COVID-19 PATIENT SCREENING TOOL Asked and negative response unless  otherwise noted:  Have you had symptoms of covid, tested positive or been in contact with someone with symptoms/positive test in the past 5-10 days?

## 2021-12-05 ENCOUNTER — Ambulatory Visit (HOSPITAL_COMMUNITY): Payer: Self-pay | Admitting: Licensed Clinical Social Worker

## 2021-12-09 ENCOUNTER — Telehealth: Payer: Self-pay

## 2021-12-09 NOTE — Telephone Encounter (Signed)
PC SW LVM for patients daughter, Rayfield Citizen. Awaiting  return call.

## 2021-12-30 ENCOUNTER — Telehealth (HOSPITAL_COMMUNITY): Payer: Self-pay | Admitting: Licensed Clinical Social Worker

## 2021-12-30 NOTE — Telephone Encounter (Signed)
Pt daughter Denise Knapp called w/ concerns about Denise Knapp. Denise Knapp is [employer] power of attorney. Scheduled virtual appointment for Denise Knapp on 10/31@11am .

## 2022-01-01 ENCOUNTER — Telehealth: Payer: Self-pay | Admitting: Psychiatry

## 2022-01-01 NOTE — Telephone Encounter (Signed)
Pt daughter,Caroline, has concerns with mother's current symptoms and wants to be evaluated.Feels that she needs her medication adjusted to improve her level of functioning. Please advise

## 2022-01-01 NOTE — Telephone Encounter (Signed)
Please schedule a follow up IN PERSON 30 - 40 minutes . Please let patient know to go to nearest urgent care or emergency if in a crisis.

## 2022-01-02 ENCOUNTER — Telehealth: Payer: Self-pay | Admitting: Psychiatry

## 2022-01-02 DIAGNOSIS — Z79899 Other long term (current) drug therapy: Secondary | ICD-10-CM

## 2022-01-02 DIAGNOSIS — F3342 Major depressive disorder, recurrent, in full remission: Secondary | ICD-10-CM

## 2022-01-02 NOTE — Telephone Encounter (Signed)
I have placed an order for lab- prolactin level which she can get done on the day that she comes in.  She also has an EKG ordered in the system, she may need to do the EKG prior to coming in since without the EKG I will not be able to make any medication changes.  Will have Janett Billow CMA contact this patient to discuss.

## 2022-01-02 NOTE — Telephone Encounter (Signed)
Patient was to have lab work done months ago. Did not feel up to it. Has called requesting if the order for labs is still current and can she complete on the upcoming appt 01-22-22. Prefers to so labs and visit all in one day due to being on oxygen. Please advise if lab order is still in system.

## 2022-01-03 NOTE — Telephone Encounter (Signed)
left message that labs and ekg needed to be done before next appt in order to do any medication changes.

## 2022-01-06 NOTE — Telephone Encounter (Signed)
pt was called and given the phone number to [facility] to set up appt for her ekg and labwork.

## 2022-01-06 NOTE — Telephone Encounter (Signed)
pt was called and given the phone number to [facility] registration to set up appt for her ekg and labwork.  

## 2022-01-09 ENCOUNTER — Other Ambulatory Visit: Payer: Medicare PPO | Admitting: Student

## 2022-01-09 DIAGNOSIS — I10 Essential (primary) hypertension: Secondary | ICD-10-CM

## 2022-01-09 DIAGNOSIS — F333 Major depressive disorder, recurrent, severe with psychotic symptoms: Secondary | ICD-10-CM

## 2022-01-09 DIAGNOSIS — Z515 Encounter for palliative care: Secondary | ICD-10-CM

## 2022-01-09 DIAGNOSIS — R0602 Shortness of breath: Secondary | ICD-10-CM

## 2022-01-09 DIAGNOSIS — R110 Nausea: Secondary | ICD-10-CM

## 2022-01-09 DIAGNOSIS — R531 Weakness: Secondary | ICD-10-CM

## 2022-01-09 NOTE — Progress Notes (Signed)
[facility] Consult Note Telephone: [phone]  Fax: [phone]    Date of encounter: 01/09/22 11:16 AM PATIENT NAME: Denise Knapp [address] [place] [address]   [phone] (home)  DOB: 02/24/1956 MRN: [id number] PRIMARY CARE PROVIDER:    Leonel Ramsay, MD,  [place] [address] [phone]  REFERRING PROVIDER:   Leonel Ramsay, MD [place],  [place] [address] [phone]  RESPONSIBLE PARTY:    Contact Information     Name Relation Home Work [place], Denise Knapp Daughter   [phone]   chelemer, Alain Marion   [phone]        I met face to face with patient in the home. Palliative Care was asked to follow this patient by consultation request of  Denise Ramsay, MD to address advance care planning and complex medical decision making. This is a follow up visit.                                   ASSESSMENT AND PLAN / RECOMMENDATIONS:   Advance Care Planning/Goals of Care: Goals include to maximize quality of life and symptom management. Patient/health care surrogate gave his/her permission to discuss. Our advance care planning conversation included a discussion about:    The value and importance of advance care planning  Experiences with loved ones who have been seriously ill or have died  Exploration of personal, cultural or spiritual beliefs that might influence medical decisions  Exploration of goals of care in the event of a sudden injury or illness  HCPOA-Daughter Denise Knapp: Full Code  Education provided on Palliative Medicine. Patient is currently receiving physical therapy. She would like to remain in the home for as long as possible; she states her daughter has expressed wanting patient to go to an AL. We discussed long range planning.   Symptom Management/Plan:  Shortness of breath-due to her COPD, ILD, chronic  respiratory failure, asthma. Continue oxygen continuously; she  is currently wearing at 4 lpm. She adjusts up to 6 lpm with exertion. Continue Esbriet, Cellcept, prednisone 15 mg daily, albuterol PRN. Follow up with pulmonology as scheduled.   Generalized weakness-due to her ILD, COPD, chronic respiratory failure. Continue therapy as directed.   Nausea-reports intermittent nausea over past several weeks. No vomiting; new order for ondansetron PRN. She has not started taking yet. Encourage bland diet. She also reports recent diarrhea; states this has improved, only one episode since seeing PCP.   Depression-patient states her mood has improved. She speaks about her grief, her mother passing away in July. Continue Prozac, risperidone as directed. She has upcoming appointment with her psychiatrist.   Hypertension-patient's blood pressure is 160/90 today. She states it has been elevated when checked by therapist recently, although she is unable to recall the readings. Will notify PCP regarding increasing her enalapril to 5 mg.   Follow up Palliative Care Visit: Palliative care will continue to follow for complex medical decision making, advance care planning, and clarification of goals. Return in 8 weeks or prn.  This visit was coded based on medical decision making (MDM).  PPS: 60%  HOSPICE ELIGIBILITY/DIAGNOSIS: TBD  Chief Complaint: Palliative Medicine follow up visit.   HISTORY OF PRESENT ILLNESS:  [name] is a 66 y.o. year old female  with COPD, ILD, chronic respiratory failure with hypoxia, MDD, T2DM.  Endorses shortness of breath with exertion; currently wearing oxygen at 4 lpm.  She has a cleaning person coming in 3 times a week. Is still receiving PT once a week. Feels she is getting stronger. She reports diarrhea over past couple of weeks; has improved. She has also had intermittent nausea, no vomiting. New order for ondansetron 4 mg every 8 hours PRN. Appetite is good most of the  time. Denies abdominal pain. No falls. Sleeping well. Feels she is grieving appropriately. She is followed by psychiatry; f/u appointment in next 2 weeks. Reports having positive outlook on life; states this is the best she has felt in some time. She has two daughters and three grand children. Talks about raising her children as a single parent. Daughter Denise Knapp has expressed interest in patient moving to an AL. Patient would like to remain independent for as long as possible. Receiving MOW.   History obtained from review of EMR, discussion with primary team, and interview with family, facility staff/caregiver and/or Ms. Guajardo.  I reviewed available labs, medications, imaging, studies and related documents from the EMR.  Records reviewed and summarized above.   ROS  A 10-Point ROS is negative, except for the pertinent positives/negatives detailed per the HPI.   Physical Exam: Pulse 78, resp, bp 160/90, sats 97% on 4 lpm Constitutional: NAD General: frail appearing EYES: anicteric sclera, lids intact, no discharge  ENMT: intact hearing, oral mucous membranes moist CV: S1S2, RRR, no LE edema Pulmonary: lungs clear, diminished,  no increased work of breathing, oxygen via nasal canula Abdomen: normo-active BS + 4 quadrants, soft and non tender, no ascites GU: deferred MSK:  moves all extremities, ambulatory Skin: warm and dry, no rashes or wounds on visible skin Neuro: +generalized weakness, A & O x 3, forgetful Psych: non-anxious affect, pleasant, cooperative Hem/lymph/immuno: no widespread bruising   Thank you for the opportunity to participate in the care of [name].  The palliative care team will continue to follow. Please call our office at [phone] if we can be of additional assistance.   Ezekiel Slocumb, NP   COVID-19 PATIENT SCREENING TOOL Asked and negative response unless otherwise noted:   Have you had symptoms of covid, tested positive or been in contact with someone  with symptoms/positive test in the past 5-10 days? No

## 2022-01-10 ENCOUNTER — Telehealth: Payer: Self-pay

## 2022-01-10 ENCOUNTER — Ambulatory Visit
Admission: RE | Admit: 2022-01-10 | Discharge: 2022-01-10 | Disposition: A | Discharge status: Home / Self Care | Payer: Medicare PPO | Source: Ambulatory Visit | Attending: Psychiatry | Admitting: Psychiatry

## 2022-01-10 ENCOUNTER — Other Ambulatory Visit
Admission: RE | Admit: 2022-01-10 | Discharge: 2022-01-10 | Disposition: A | Discharge status: Home / Self Care | Payer: Medicare PPO | Source: Home / Self Care | Attending: Psychiatry | Admitting: Psychiatry

## 2022-01-10 DIAGNOSIS — Z9189 Other specified personal risk factors, not elsewhere classified: Secondary | ICD-10-CM | POA: Insufficient documentation

## 2022-01-10 DIAGNOSIS — Z79899 Other long term (current) drug therapy: Secondary | ICD-10-CM | POA: Diagnosis not present

## 2022-01-10 DIAGNOSIS — F3342 Major depressive disorder, recurrent, in full remission: Secondary | ICD-10-CM | POA: Diagnosis not present

## 2022-01-10 NOTE — Telephone Encounter (Signed)
1045 am.  Message received from [place] with PCP office.  No changes to enalapril at this time.  Patient will be seen in PCP office next week and this will be addressed.  Notified Charlann Boxer, NP and patient.

## 2022-01-10 NOTE — Telephone Encounter (Signed)
911 am.  Message received from [facility], NP to follow up with PCP regarding blood pressures.  Enalapril may need to be increased to 5 mg daily.  Phone call made to [facility] to obtain bp readings as therapy is currently involved with patient.   9/13- bp 150/87 p 92 9/18-bp 138/82 p 72 9/22-bp 137/75 p 90 9/27-bp 148/90 p 86 9/28-bp 160/90 p 78  Phone call made to PCP office with above information.  Message will be sent to provider.

## 2022-01-11 LAB — PROLACTIN: Prolactin: 49.5 ng/mL — ABNORMAL HIGH (ref 4.8–23.3)

## 2022-01-13 ENCOUNTER — Ambulatory Visit (INDEPENDENT_AMBULATORY_CARE_PROVIDER_SITE_OTHER): Payer: Medicare PPO | Admitting: Licensed Clinical Social Worker

## 2022-01-13 ENCOUNTER — Encounter (HOSPITAL_COMMUNITY): Payer: Self-pay | Admitting: Licensed Clinical Social Worker

## 2022-01-13 DIAGNOSIS — F3342 Major depressive disorder, recurrent, in full remission: Secondary | ICD-10-CM

## 2022-01-13 DIAGNOSIS — Z634 Disappearance and death of family member: Secondary | ICD-10-CM | POA: Diagnosis not present

## 2022-01-13 NOTE — Plan of Care (Signed)
  Problem: Decrease depressive symptoms and improve levels of effective functioning Goal: LTG: Reduce frequency, intensity, and duration of depression symptoms as evidenced by: pt self report Outcome: Progressing Note: Pt reports that she is not feeling depressed--pts daughter has concerns that she is feeling more depressed Goal: STG: Denise Knapp WILL PARTICIPATE IN AT LEAST 80% OF SCHEDULED INDIVIDUAL PSYCHOTHERAPY SESSIONS Description: Pt is on time for appt Outcome: Progressing Intervention: Encourage self-care activities Note: Reviewed/managed Intervention: REVIEW PLEASE SKILLS (TREAT PHYSICAL ILLNESS, BALANCE EATING, AVOID MOOD-ALTERING SUBSTANCES, BALANCE SLEEP AND GET EXERCISE) WITH Denise Knapp Note: Reviewed

## 2022-01-13 NOTE — Progress Notes (Signed)
Virtual Visit via Video Note  I connected with Denise Knapp on 01/13/22 at 10:00 AM EDT by a video enabled telemedicine application and verified that I am speaking with the correct person using two identifiers.  Pt is accompanied by her daughter, Denise Knapp, throughout session.   Location: Patient: home Provider: remote office [place], [name]   I discussed the limitations of evaluation and management by telemedicine and the availability of in person appointments. The patient expressed understanding and agreed to proceed.    I discussed the assessment and treatment plan with the patient. The patient was provided an opportunity to ask questions and all were answered. The patient agreed with the plan and demonstrated an understanding of the instructions.   The patient was advised to call back or seek an in-person evaluation if the symptoms worsen or if the condition fails to improve as anticipated.  I provided 60 minutes of non-face-to-face time during this encounter.    R , LCSW   THERAPIST PROGRESS NOTE  Session Time: 10-11a   Participation Level: Active  Behavioral Response: NAAlertEuthymic  Type of Therapy: Individual Therapy  ProgressTowards Goals: Progressing  Treatment Goals addressed: Problem: Decrease depressive symptoms and improve levels of effective functioning Goal: LTG: Reduce frequency, intensity, and duration of depression symptoms as evidenced by: pt self report Outcome: Progressing Note: Pt reports that she is not feeling depressed--pts daughter has concerns that she is feeling more depressed Goal: STG: Denise Knapp WILL PARTICIPATE IN AT LEAST 80% OF SCHEDULED INDIVIDUAL PSYCHOTHERAPY SESSIONS Description: Pt is on time for appt Outcome: Progressing Intervention: Encourage self-care activities Note: Reviewed/managed Intervention: REVIEW PLEASE SKILLS (TREAT PHYSICAL ILLNESS, BALANCE EATING, AVOID MOOD-ALTERING SUBSTANCES, BALANCE SLEEP AND  GET EXERCISE) WITH Denise Knapp Note: Reviewed   Interventions: CBT and supportive . Summary: Denise Knapp is a 66 y.o. female who presents with improving symptoms related to depression diagnosis. Pt daughter feels that pt is more paranoid and is discussing the past more frequently than she has in the past. Daughter concerned that 9/29 was a big trigger: date of her mother's birthday and the date that her husband passed. Pt daughter feels her  mother has been more quiet, withdrawn, and not "herself".   Discussed symptoms that patient is experiencing since losing her mother in July. Patient does report that she feels sad, quiet, and that her anxiety levels have increased since then. Patient reports that she knows that she worries a lot about her daughters and a lot about her grandchildren, and is aware that a lot of it is triggered by traumas from her past. Patients daughter is with her and shares her insight about guilt that her mother may be feeling.  Discussed patients psychiatric history, which has included inpatient hospitalizations and involuntary commitments. Patient and daughter both agree that it is a patient's biggest fear that she will be hospitalized in patient again. Discussed current symptoms, and shared with patient and mother that she is not suicidal, not homicidal, and not experiencing perceptual disturbances currently, so technically she does not mean inpatient criteria. Explored the previous incidents/time period and pointed out to patient and mother that she did not have access to psychiatric medication, psychiatric support, no counseling, and did not have any outpatient services at the time of her previous hospitalization and involuntary commitment. Allowed patient to recognize that she currently does have outpatient psychiatric supports. Patient reflects understanding and feels less anxious.   Patient has been sending text messages to her daughter, often expanding her thoughts and feelings  surrounding  traumatic events from the past. Discussed with patient and her daughter how journaling could be beneficial, and patient could review journal entries with her daughter and with counselor. Patient feels that this would be a good intervention at this time.  Encouraged patient that her grieving process is a process and that there is no time limit on when she gets through.  Discussed holiday group sessions provided through [facility] for individuals that have recently lost a family member.  Continued recommendations are as follows: self care behaviors, positive social engagements, focusing on overall work/home/life balance, and focusing on positive physical and emotional wellness. .   Suicidal/Homicidal: No  Therapist Response: Pt is continuing to apply interventions learned in session into daily life situations. Pt is currently on track to meet goals utilizing interventions mentioned above. Personal growth and progress noted. Treatment to continue as indicated.   Plan: Pt is continuing to apply interventions learned in session into daily life situations. Pt is currently on track to meet goals utilizing interventions mentioned above. Personal growth and progress noted. Treatment to continue as indicated.   Diagnosis:   Encounter Diagnoses  Name Primary?   MDD (major depressive disorder), recurrent, in full remission ([place]) Yes   Bereavement     Collaboration of Care: Other pt encouraged to continue care with psychiatrist of record, Dr. Ursula Alert  Patient/Guardian was advised Release of Information must be obtained prior to any record release in order to collaborate their care with an outside provider. Patient/Guardian was advised if they have not already done so to contact the registration department to sign all necessary forms in order for [place] to release information regarding their care.   Consent: Patient/Guardian gives verbal consent for treatment and assignment of  benefits for services provided during this visit. Patient/Guardian expressed understanding and agreed to proceed.     [place], LCSW 01/13/2022

## 2022-01-15 ENCOUNTER — Telehealth: Payer: Self-pay

## 2022-01-15 NOTE — Telephone Encounter (Signed)
pt called states that she had the EKG and labwork done.

## 2022-01-15 NOTE — Telephone Encounter (Signed)
Noted and will discuss with patient at visit, please advice patient to continue medications

## 2022-01-22 ENCOUNTER — Telehealth: Payer: Medicare PPO | Admitting: Psychiatry

## 2022-01-22 NOTE — Progress Notes (Signed)
Patient had connection trouble - unable to connect by video . Patient will reschedule,communicated with staff.

## 2022-01-30 ENCOUNTER — Telehealth (HOSPITAL_COMMUNITY): Payer: Self-pay | Admitting: Licensed Clinical Social Worker

## 2022-01-30 NOTE — Telephone Encounter (Signed)
LCSW clinician responding to message sent by [employer] to return pts phone call.   LCSW attempted to call pt at 1:56pm with no answer--LMVM that I attempted to call pt and to reach out again if needed.

## 2022-01-31 ENCOUNTER — Telehealth (HOSPITAL_COMMUNITY): Payer: Self-pay | Admitting: *Deleted

## 2022-01-31 NOTE — Telephone Encounter (Signed)
Pt LVM stating she was returning your call. Pt states that "the crisis" has passed and she is moving to assisted living facility on 02/03/22 and that her daughter is helping facilitate this transition. FYI.

## 2022-02-11 ENCOUNTER — Ambulatory Visit (INDEPENDENT_AMBULATORY_CARE_PROVIDER_SITE_OTHER): Payer: Medicare PPO | Admitting: Licensed Clinical Social Worker

## 2022-02-11 DIAGNOSIS — Z634 Disappearance and death of family member: Secondary | ICD-10-CM | POA: Diagnosis not present

## 2022-02-11 DIAGNOSIS — F3342 Major depressive disorder, recurrent, in full remission: Secondary | ICD-10-CM

## 2022-02-11 NOTE — Progress Notes (Signed)
Virtual Visit via Video Note  I connected with Denise Knapp on 02/11/22 at 11:00 AM EDT by a video enabled telemedicine application and verified that I am speaking with the correct person using two identifiers.  Pt provides informed consent to conduct session initially in the presence of ALF staff member (let pt borrow her telephone for the session).   Location: Patient: home (ALF) Provider: remote office [place], [name]   I discussed the limitations of evaluation and management by telemedicine and the availability of in person appointments. The patient expressed understanding and agreed to proceed.   I discussed the assessment and treatment plan with the patient. The patient was provided an opportunity to ask questions and all were answered. The patient agreed with the plan and demonstrated an understanding of the instructions.   The patient was advised to call back or seek an in-person evaluation if the symptoms worsen or if the condition fails to improve as anticipated.  I provided 30 minutes of non-face-to-face time during this encounter.    R , LCSW   THERAPIST PROGRESS NOTE  Session Time: 11-1130a  Participation Level: Active  Behavioral Response: NAAlertEuthymic  Type of Therapy: Individual Therapy  ProgressTowards Goals: Progressing  Treatment Goals addressed: Problem: Decrease depressive symptoms and improve levels of effective functioning Goal: LTG: Reduce frequency, intensity, and duration of depression symptoms as evidenced by: pt self report Outcome: Progressing Goal: STG: Denise Knapp WILL PARTICIPATE IN AT LEAST 80% OF SCHEDULED INDIVIDUAL PSYCHOTHERAPY SESSIONS Description: Pt is on time for appt Outcome: Progressing Intervention: Encourage new environment or opportunities for social interaction Note: Pt reports more engagement socially since moving into ALF Intervention: Encourage self-care activities Note: Allowed pt to identify self care  activities that are manageable   Interventions: CBT and supportive . Summary: Denise Knapp is a 66 y.o. female who presents with improving symptoms related to depression and bereavement diagnosis. Pt reports that she is compliant with medication and is getting better quality and quantity of sleep "I have good days and bad days".   Allowed pt to explore and express thoughts and feelings associated with recent life situations and external stressors. Pt reports that herself and her daughter made the decision for pt to move into ALF facility. Pt reports that she is doing very well "much better physically especially". Pt reports that she has engaged more socially with others in the past month than she has in years. "I am really enjoying this".  Allowed pt to explore relationships w/ daughter--pt feels that they are closer since spending all the time together after pts mother passed.   Assisted pt with identifying triggers and coping skills to manage depression symptoms. Pt states that she will be working with ALF psychiatric supports in the future. Pt wants to continue PRN.   Continued recommendations are as follows: self care behaviors, positive social engagements, focusing on overall work/home/life balance, and focusing on positive physical and emotional wellness. .   Suicidal/Homicidal: No  Therapist Response: Pt is continuing to apply interventions learned in session into daily life situations. Pt is currently on track to meet goals utilizing interventions mentioned above. Personal growth and progress noted. Treatment to continue as indicated.   Plan: Pt to return to office PRN. Pt will be receiving psychiatric services from ALF providers.  Diagnosis:   Encounter Diagnoses  Name Primary?   MDD (major depressive disorder), recurrent, in full remission ([facility]) Yes   Bereavement     Collaboration of Care: Other pt encouraged to continue care with  psychiatrist of record, Dr. Ursula Alert  Patient/Guardian was advised Release of Information must be obtained prior to any record release in order to collaborate their care with an outside provider. Patient/Guardian was advised if they have not already done so to contact the registration department to sign all necessary forms in order for [place] to release information regarding their care.   Consent: Patient/Guardian gives verbal consent for treatment and assignment of benefits for services provided during this visit. Patient/Guardian expressed understanding and agreed to proceed.   [place], LCSW 02/11/2022

## 2022-02-11 NOTE — Plan of Care (Signed)
  Problem: Decrease depressive symptoms and improve levels of effective functioning Goal: LTG: Reduce frequency, intensity, and duration of depression symptoms as evidenced by: pt self report Outcome: Progressing Goal: STG: Denise Knapp WILL PARTICIPATE IN AT LEAST 80% OF SCHEDULED INDIVIDUAL PSYCHOTHERAPY SESSIONS Description: Pt is on time for appt Outcome: Progressing Intervention: Encourage new environment or opportunities for social interaction Note: Pt reports more engagement socially since moving into ALF Intervention: Encourage self-care activities Note: Allowed pt to identify self care activities that are manageable

## 2022-02-18 ENCOUNTER — Ambulatory Visit: Payer: Medicare PPO | Admitting: Psychiatry

## 2022-02-23 ENCOUNTER — Other Ambulatory Visit: Payer: Self-pay | Admitting: Psychiatry

## 2022-02-23 DIAGNOSIS — F3342 Major depressive disorder, recurrent, in full remission: Secondary | ICD-10-CM

## 2022-03-03 ENCOUNTER — Ambulatory Visit (HOSPITAL_COMMUNITY): Payer: Medicare PPO | Admitting: Licensed Clinical Social Worker

## 2022-03-03 ENCOUNTER — Encounter (HOSPITAL_COMMUNITY): Payer: Self-pay

## 2022-03-08 ENCOUNTER — Other Ambulatory Visit: Payer: Self-pay | Admitting: Psychiatry

## 2022-03-08 DIAGNOSIS — F3342 Major depressive disorder, recurrent, in full remission: Secondary | ICD-10-CM

## 2022-03-14 ENCOUNTER — Telehealth: Payer: Self-pay | Admitting: Psychiatry

## 2022-03-14 NOTE — Telephone Encounter (Signed)
Noted  

## 2022-03-14 NOTE — Telephone Encounter (Signed)
Patients daughter Rayfield Citizen called in to cancel the patients upcoming appointment because the patient is on hospice care and is going to get psychiatry services in her home. I will leave the daughters number so if you'd like to speak with her regarding this.  Caroline's # [phone]

## 2022-03-18 ENCOUNTER — Ambulatory Visit: Payer: Medicare PPO | Admitting: Psychiatry

## 2022-03-18 ENCOUNTER — Other Ambulatory Visit: Payer: Self-pay | Admitting: Psychiatry

## 2022-03-18 DIAGNOSIS — F3342 Major depressive disorder, recurrent, in full remission: Secondary | ICD-10-CM

## 2022-10-29 IMAGING — MG MM DIGITAL SCREENING BILAT W/ TOMO AND CAD
8 of 15 series · 8 of 40 positions shown · non-contrast
Comparison: Previous exam(s).

CLINICAL DATA: Screening.

EXAM:
DIGITAL SCREENING BILATERAL MAMMOGRAM WITH TOMOSYNTHESIS AND CAD
TECHNIQUE: Bilateral screening digital craniocaudal and mediolateral oblique
mammograms were obtained. Bilateral screening digital breast
tomosynthesis was performed. The images were evaluated with
computer-aided detection.

[R CC synth-2D (1 of 2)]
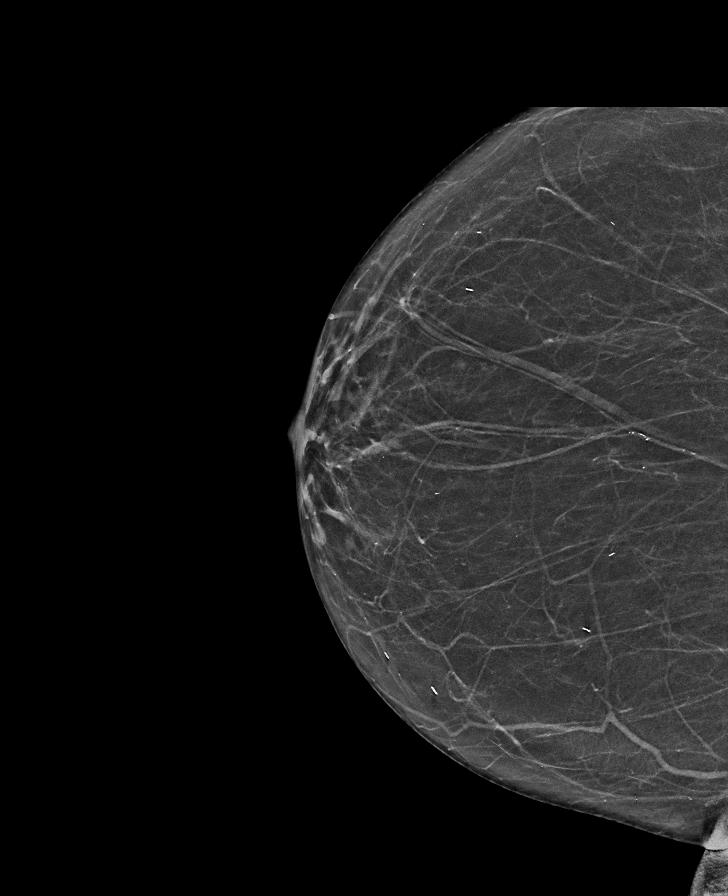

[L MLO synth-2D (1 of 2)]
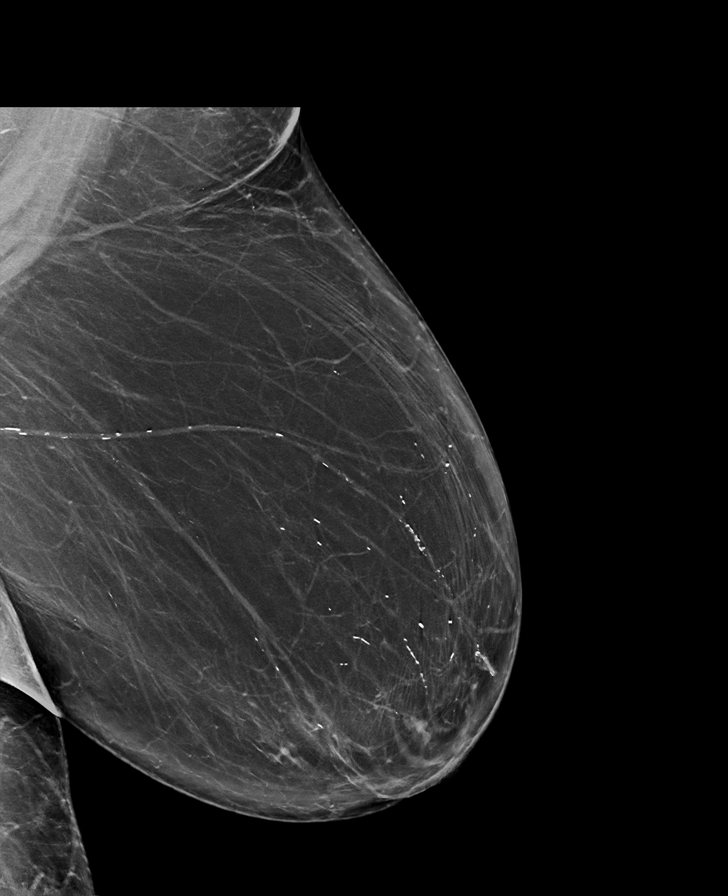

[L CC synth-2D]
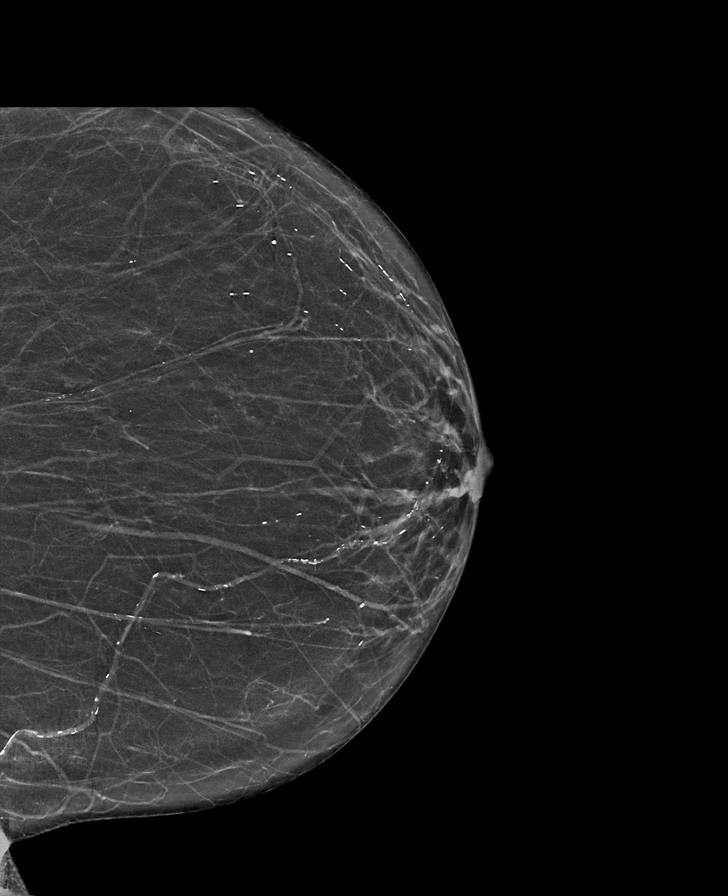

[L MLO synth-2D (2 of 2)]
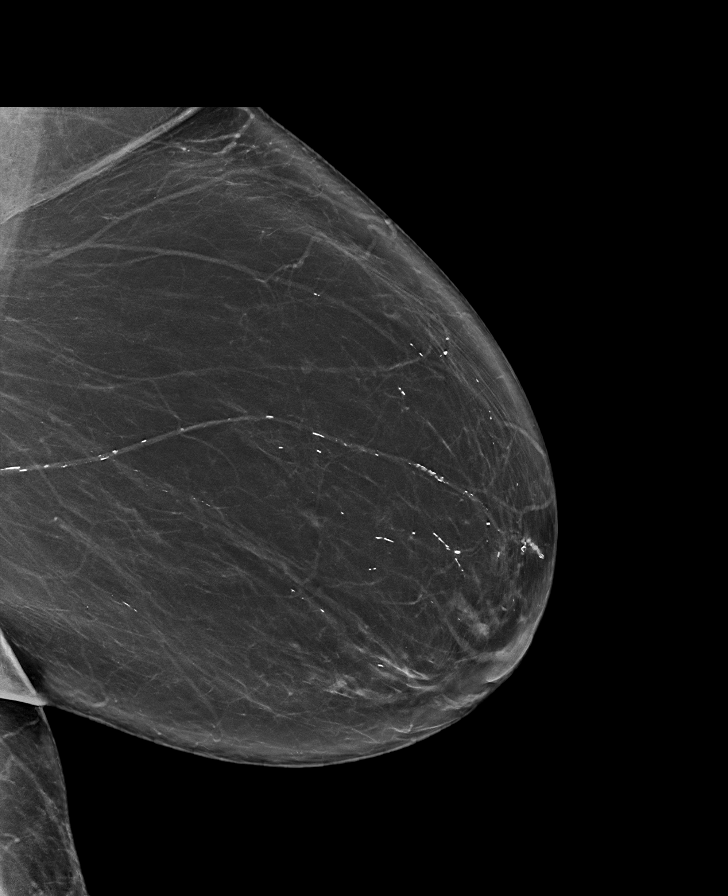

[R MLO synth-2D (1 of 2)]
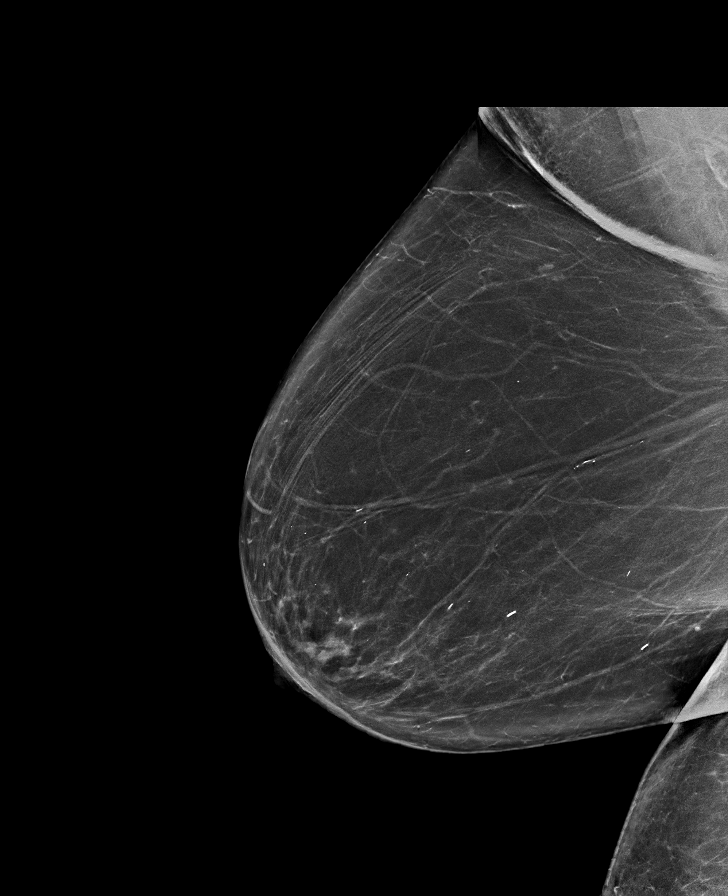

[R MLO synth-2D (2 of 2)]
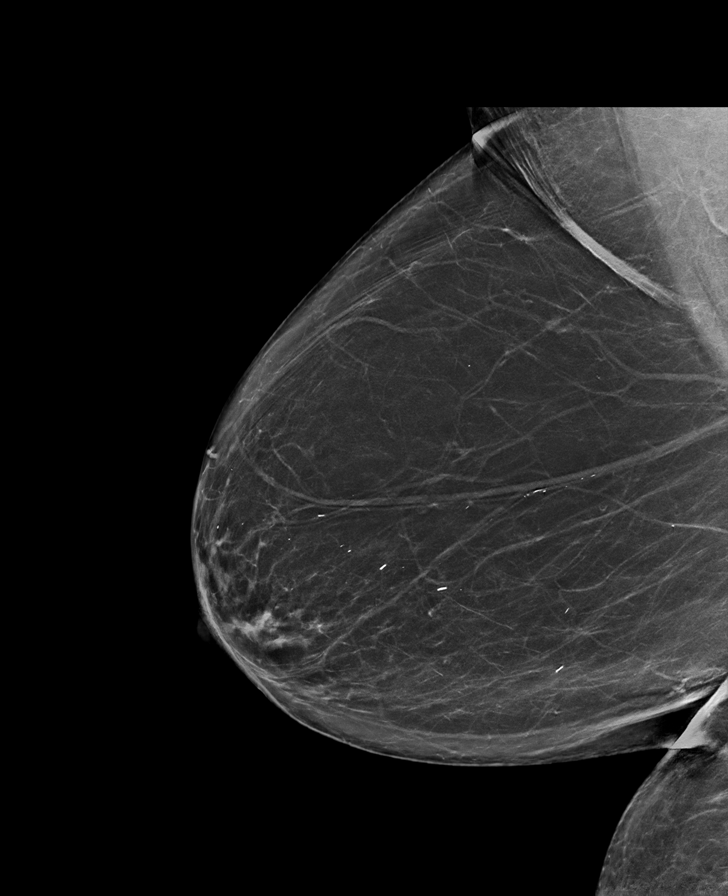

[R CC synth-2D (2 of 2)]
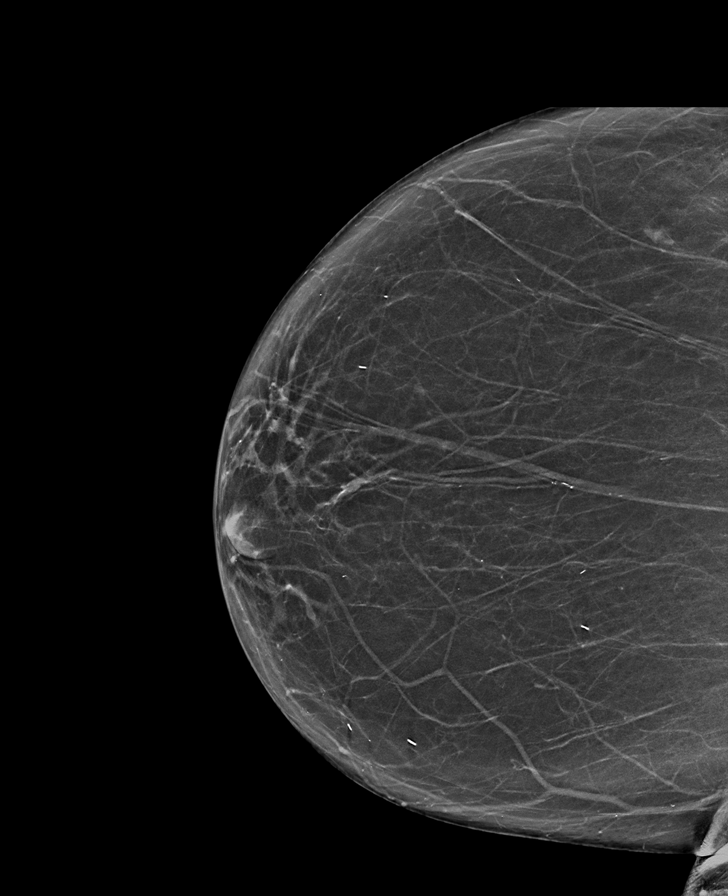

[L MLO tomo · tomo slice 59/87.0]
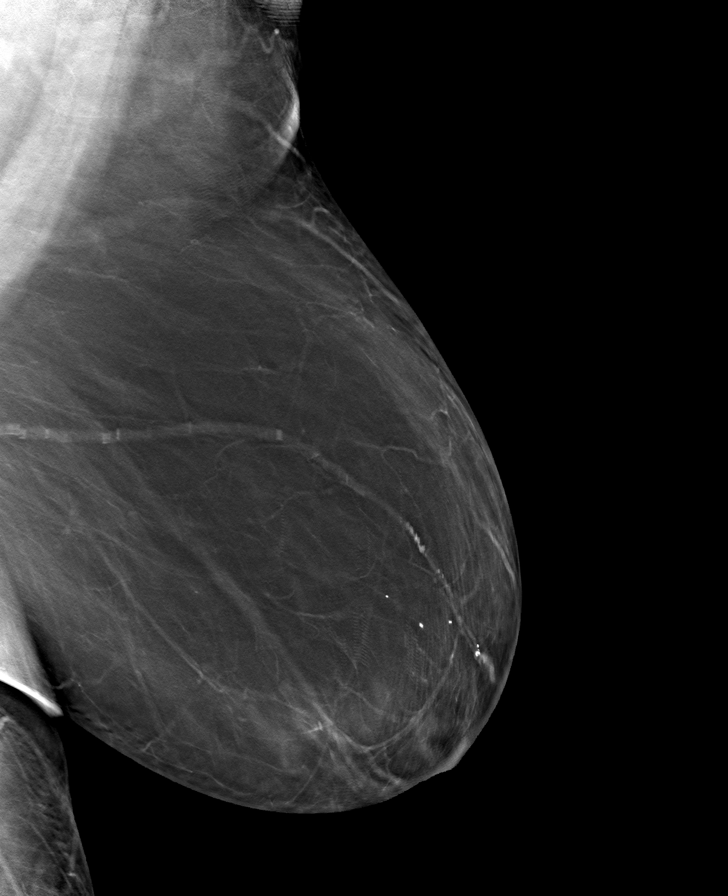

[8 of 40 positions shown; findings below may reference images not displayed]

ACR Breast Density Category b: There are scattered areas of
fibroglandular density.
FINDINGS: There are no findings suspicious for malignancy.
IMPRESSION: No mammographic evidence of malignancy. A result letter of this
screening mammogram will be mailed directly to the patient.

RECOMMENDATION:
Screening mammogram in one year. (Code:51-O-LD2)

BI-RADS CATEGORY  1: Negative.

## 2022-11-23 ENCOUNTER — Other Ambulatory Visit: Payer: Self-pay

## 2022-11-23 ENCOUNTER — Emergency Department
Admission: EM | Admit: 2022-11-23 | Discharge: 2022-11-24 | Disposition: A | Discharge status: Home / Self Care | Attending: Emergency Medicine | Admitting: Emergency Medicine

## 2022-11-23 DIAGNOSIS — R112 Nausea with vomiting, unspecified: Secondary | ICD-10-CM | POA: Insufficient documentation

## 2022-11-23 DIAGNOSIS — R197 Diarrhea, unspecified: Secondary | ICD-10-CM | POA: Diagnosis not present

## 2022-11-23 DIAGNOSIS — N189 Chronic kidney disease, unspecified: Secondary | ICD-10-CM | POA: Diagnosis not present

## 2022-11-23 LAB — CBC
HCT: 29.9 % — ABNORMAL LOW (ref 36.0–46.0)
Hemoglobin: 9.3 g/dL — ABNORMAL LOW (ref 12.0–15.0)
MCH: 26.1 pg (ref 26.0–34.0)
MCHC: 31.1 g/dL (ref 30.0–36.0)
MCV: 83.8 fL (ref 80.0–100.0)
Platelets: 250 10*3/uL (ref 150–400)
RBC: 3.57 MIL/uL — ABNORMAL LOW (ref 3.87–5.11)
RDW: 15.1 % (ref 11.5–15.5)
WBC: 10 10*3/uL (ref 4.0–10.5)
nRBC: 0 % (ref 0.0–0.2)

## 2022-11-23 LAB — URINALYSIS, ROUTINE W REFLEX MICROSCOPIC
Bacteria, UA: NONE SEEN
Bilirubin Urine: NEGATIVE
Glucose, UA: 150 mg/dL — AB
Hgb urine dipstick: NEGATIVE
Ketones, ur: NEGATIVE mg/dL
Leukocytes,Ua: NEGATIVE
Nitrite: NEGATIVE
Protein, ur: NEGATIVE mg/dL
Specific Gravity, Urine: 1.014 (ref 1.005–1.030)
pH: 5 (ref 5.0–8.0)

## 2022-11-23 LAB — COMPREHENSIVE METABOLIC PANEL
ALT: 55 U/L — ABNORMAL HIGH (ref 0–44)
AST: 122 U/L — ABNORMAL HIGH (ref 15–41)
Albumin: 2.7 g/dL — ABNORMAL LOW (ref 3.5–5.0)
Alkaline Phosphatase: 47 U/L (ref 38–126)
Anion gap: 11 (ref 5–15)
BUN: 40 mg/dL — ABNORMAL HIGH (ref 8–23)
CO2: 23 mmol/L (ref 22–32)
Calcium: 6.9 mg/dL — ABNORMAL LOW (ref 8.9–10.3)
Chloride: 99 mmol/L (ref 98–111)
Creatinine, Ser: 1.47 mg/dL — ABNORMAL HIGH (ref 0.44–1.00)
GFR, Estimated: 39 mL/min — ABNORMAL LOW (ref >60)
Glucose, Bld: 286 mg/dL — ABNORMAL HIGH (ref 70–99)
Potassium: 4 mmol/L (ref 3.5–5.1)
Sodium: 133 mmol/L — ABNORMAL LOW (ref 135–145)
Total Bilirubin: 0.6 mg/dL (ref 0.3–1.2)
Total Protein: 6.1 g/dL — ABNORMAL LOW (ref 6.5–8.1)

## 2022-11-23 LAB — LIPASE, BLOOD: Lipase: 63 U/L — ABNORMAL HIGH (ref 11–51)

## 2022-11-23 MED ORDER — METOCLOPRAMIDE HCL 5 MG/ML IJ SOLN
10.0000 mg | Freq: Once | INTRAMUSCULAR | Status: AC
  Administered 2022-11-23: 10 mg via INTRAVENOUS
  Filled 2022-11-23: qty 2

## 2022-11-23 MED ORDER — SODIUM CHLORIDE 0.9 % IV BOLUS
500.0000 mL | Freq: Once | INTRAVENOUS | Status: AC
  Administered 2022-11-23: 500 mL via INTRAVENOUS

## 2022-11-23 NOTE — ED Notes (Signed)
First Nurse Note: Patient to ED via [facility] from [place]- on hospice care. Having N/V/D x2 days with diarrhea being "jelly like"- concerned for c diff. Given 4mg  zofran and 500 NaCL from EMS- 20 R forearm  486 cbg 82 HR  97% 6L [place] (baseline) 96/46

## 2022-11-24 LAB — CLOSTRIDIUM DIFFICILE BY PCR, REFLEXED: Toxigenic C. Difficile by PCR: POSITIVE — AB

## 2022-11-24 MED ORDER — METOCLOPRAMIDE HCL 10 MG PO TABS: 10.0000 mg | ORAL_TABLET | Freq: Three times a day (TID) | ORAL | 0 refills | Status: DC | PRN

## 2022-11-24 NOTE — ED Provider Notes (Signed)
[facility] Provider Note    Event Date/Time   First MD Initiated Contact with Patient 11/23/22 1902     (approximate)   History   Abdominal Pain   HPI  Denise Knapp is a 67 y.o. female with history of pulmonary fibrosis on 6 to 8 L nasal cannula at home, CKD presenting to the emergency department for evaluation of vomiting and diarrhea.  Patient is currently in hospice at [facility].  For the past several days she has had multiple episodes of vomiting and diarrhea.  No fevers.  No abdominal pain.  They were concerned in her facility about possible C. difficile versus norovirus versus diabetic gastroparesis so they sent her to the emergency department for IV fluids and further evaluation.      Physical Exam   Triage Vital Signs: ED Triage Vitals [11/23/22 1852]  Encounter Vitals Group     BP 115/68     Systolic BP Percentile      Diastolic BP Percentile      Pulse Rate 93     Resp 20     Temp 98.4 F (36.9 C)     Temp Source Oral     SpO2 98 %     Weight      Height      Head Circumference      Peak Flow      Pain Score 0     Pain Loc      Pain Education      Exclude from Growth Chart     Most recent vital signs: Vitals:   11/23/22 2100 11/23/22 2130  BP: 122/61 (!) 123/59  Pulse: 91 91  Resp:    Temp:    SpO2: 98% 97%     General: Awake, interactive  CV:  Regular rate, good peripheral perfusion.  Resp:  Lungs clear, unlabored respirations.  Abd:  Soft, nondistended, nontender to palpation throughout Neuro:  Symmetric facial movement, fluid speech   ED Results / Procedures / Treatments   Labs (all labs ordered are listed, but only abnormal results are displayed) Labs Reviewed  LIPASE, BLOOD - Abnormal; Notable for the following components:      Result Value   Lipase 63 (*)    All other components within normal limits  COMPREHENSIVE METABOLIC PANEL - Abnormal; Notable for the following components:   Sodium 133 (*)     Glucose, Bld 286 (*)    BUN 40 (*)    Creatinine, Ser 1.47 (*)    Calcium 6.9 (*)    Total Protein 6.1 (*)    Albumin 2.7 (*)    AST 122 (*)    ALT 55 (*)    GFR, Estimated 39 (*)    All other components within normal limits  CBC - Abnormal; Notable for the following components:   RBC 3.57 (*)    Hemoglobin 9.3 (*)    HCT 29.9 (*)    All other components within normal limits  URINALYSIS, ROUTINE W REFLEX MICROSCOPIC - Abnormal; Notable for the following components:   Color, Urine YELLOW (*)    APPearance HAZY (*)    Glucose, UA 150 (*)    All other components within normal limits  C DIFFICILE QUICK SCREEN W PCR REFLEX       EKG EKG independently reviewed interpreted by myself (ER attending) demonstrates:    RADIOLOGY Imaging independently reviewed and interpreted by myself demonstrates:    PROCEDURES:  Critical Care performed: No  Procedures   MEDICATIONS ORDERED IN ED: Medications  sodium chloride 0.9 % bolus 500 mL (500 mLs Intravenous New Bag/Given 11/23/22 2029)  metoCLOPramide (REGLAN) injection 10 mg (10 mg Intravenous Given 11/23/22 2029)     IMPRESSION / MDM / ASSESSMENT AND PLAN / ED COURSE  I reviewed the triage vital signs and the nursing notes.  Differential diagnosis includes, but is not limited to, viral illness, diabetic gastroparesis, C. difficile, pancreatitis, lower suspicion for significant acute intra-abdominal process given reassuring abdominal exam  Patient's presentation is most consistent with acute presentation with potential threat to life or bodily function.  67 year old female on hospice presenting to the emergency department for several days of vomiting and diarrhea.  Labs here demonstrate normal white blood cell count, anemia with hemoglobin of 9.3, slightly down from 11 a few months ago, but no reported active bleeding from patient.  She does have a slight AKI with creatinine of 1.47, most recent creatinine 1.1.  Suspect that this is  due to dehydration.  Lipase slightly elevated, but does not meet criteria for pancreatitis.  UA without evidence of infection.  Patient unable to provide stool sample for C. difficile testing.  She was treated symptomatically with IV fluids and Reglan.  Does feel improved following this and is tolerating a p.o. trial.  I did attempt to contact patient's daughter to discuss disposition, but unfortunately this went directly to voicemail.  I did discuss discharge back to the patient's facility with nausea medication and patient is comfortable with this plan.  The patient's RN did speak with the hospice nurse and they are comfortable with discharge back to her facility.  Patient discharged stable condition with prescription for Reglan.      FINAL CLINICAL IMPRESSION(S) / ED DIAGNOSES   Final diagnoses:  Nausea vomiting and diarrhea     Rx / DC Orders   ED Discharge Orders          Ordered    metoCLOPramide (REGLAN) 10 MG tablet  Every 8 hours PRN        11/24/22 0007             Note:  This document was prepared using Dragon voice recognition software and may include unintentional dictation errors.   Trinna Post, MD 11/24/22 [phone]

## 2022-11-24 NOTE — Discharge Instructions (Addendum)
You were seen in the emergency department today for evaluation of your vomiting and diarrhea.  You were given IV fluids and nausea medication.  I am glad you are feeling better.  Have sent a prescription for nausea medication to your pharmacy.  Please continue to follow-up as directed by your hospice team.

## 2022-11-24 NOTE — ED Provider Notes (Incomplete)
[facility] Provider Note    Event Date/Time   First MD Initiated Contact with Patient 11/23/22 1902     (approximate)   History   Abdominal Pain   HPI  Denise Knapp is a 67 y.o. female        Physical Exam   Triage Vital Signs: ED Triage Vitals [11/23/22 1852]  Encounter Vitals Group     BP 115/68     Systolic BP Percentile      Diastolic BP Percentile      Pulse Rate 93     Resp 20     Temp 98.4 F (36.9 C)     Temp Source Oral     SpO2 98 %     Weight      Height      Head Circumference      Peak Flow      Pain Score 0     Pain Loc      Pain Education      Exclude from Growth Chart     Most recent vital signs: Vitals:   11/23/22 2100 11/23/22 2130  BP: 122/61 (!) 123/59  Pulse: 91 91  Resp:    Temp:    SpO2: 98% 97%     General: Awake, interactive *** CV:  Regular rate, good peripheral perfusion. *** Resp:  Lungs clear, unlabored respirations. *** Abd:  Soft, nondistended. *** Neuro:  Symmetric facial movement, fluid speech***   ED Results / Procedures / Treatments   Labs (all labs ordered are listed, but only abnormal results are displayed) Labs Reviewed  LIPASE, BLOOD - Abnormal; Notable for the following components:      Result Value   Lipase 63 (*)    All other components within normal limits  COMPREHENSIVE METABOLIC PANEL - Abnormal; Notable for the following components:   Sodium 133 (*)    Glucose, Bld 286 (*)    BUN 40 (*)    Creatinine, Ser 1.47 (*)    Calcium 6.9 (*)    Total Protein 6.1 (*)    Albumin 2.7 (*)    AST 122 (*)    ALT 55 (*)    GFR, Estimated 39 (*)    All other components within normal limits  CBC - Abnormal; Notable for the following components:   RBC 3.57 (*)    Hemoglobin 9.3 (*)    HCT 29.9 (*)    All other components within normal limits  URINALYSIS, ROUTINE W REFLEX MICROSCOPIC - Abnormal; Notable for the following components:   Color, Urine YELLOW (*)    APPearance HAZY  (*)    Glucose, UA 150 (*)    All other components within normal limits  C DIFFICILE QUICK SCREEN W PCR REFLEX       EKG EKG independently reviewed interpreted by myself (ER attending) demonstrates:    RADIOLOGY Imaging independently reviewed and interpreted by myself demonstrates:    PROCEDURES:  Critical Care performed: {CriticalCareYesNo:19197::"Yes, see critical care procedure note(s)","No"}  Procedures   MEDICATIONS ORDERED IN ED: Medications  sodium chloride 0.9 % bolus 500 mL (500 mLs Intravenous New Bag/Given 11/23/22 2029)  metoCLOPramide (REGLAN) injection 10 mg (10 mg Intravenous Given 11/23/22 2029)     IMPRESSION / MDM / ASSESSMENT AND PLAN / ED COURSE  I reviewed the triage vital signs and the nursing notes.  Differential diagnosis includes, but is not limited to, ***  Patient's presentation is most consistent with {EM COPA:27473}  ***  FINAL CLINICAL IMPRESSION(S) / ED DIAGNOSES   Final diagnoses:  None     Rx / DC Orders   ED Discharge Orders     None        Note:  This document was prepared using Dragon voice recognition software and may include unintentional dictation errors.

## 2022-11-24 NOTE — ED Notes (Signed)
Transport has been arranged with [facility] for pt. Spoke with rep. Fredric Mare she stated she has put pt. On the list.

## 2022-11-24 NOTE — ED Notes (Signed)
At approx 2332 RN from [facility] called this RN back in regards to Dr. Rosalia Hammers question of if pt can return to [facility] and per RN from [employer] she

## 2023-07-23 ENCOUNTER — Other Ambulatory Visit: Payer: Self-pay

## 2023-07-23 ENCOUNTER — Inpatient Hospital Stay
Admission: EM | Admit: 2023-07-23 | Discharge: 2023-07-25 | DRG: 872 | Disposition: A | Discharge status: Hospice | Source: Skilled Nursing Facility | Attending: Internal Medicine | Admitting: Internal Medicine

## 2023-07-23 ENCOUNTER — Encounter: Payer: Self-pay | Admitting: Emergency Medicine

## 2023-07-23 DIAGNOSIS — Z79899 Other long term (current) drug therapy: Secondary | ICD-10-CM

## 2023-07-23 DIAGNOSIS — E785 Hyperlipidemia, unspecified: Secondary | ICD-10-CM | POA: Diagnosis present

## 2023-07-23 DIAGNOSIS — Z515 Encounter for palliative care: Secondary | ICD-10-CM

## 2023-07-23 DIAGNOSIS — G3184 Mild cognitive impairment, so stated: Secondary | ICD-10-CM | POA: Diagnosis present

## 2023-07-23 DIAGNOSIS — R531 Weakness: Principal | ICD-10-CM

## 2023-07-23 DIAGNOSIS — Z9981 Dependence on supplemental oxygen: Secondary | ICD-10-CM

## 2023-07-23 DIAGNOSIS — J8410 Pulmonary fibrosis, unspecified: Secondary | ICD-10-CM | POA: Diagnosis present

## 2023-07-23 DIAGNOSIS — E875 Hyperkalemia: Secondary | ICD-10-CM | POA: Diagnosis present

## 2023-07-23 DIAGNOSIS — R6520 Severe sepsis without septic shock: Secondary | ICD-10-CM | POA: Diagnosis present

## 2023-07-23 DIAGNOSIS — G4733 Obstructive sleep apnea (adult) (pediatric): Secondary | ICD-10-CM | POA: Diagnosis present

## 2023-07-23 DIAGNOSIS — F32A Depression, unspecified: Secondary | ICD-10-CM | POA: Diagnosis present

## 2023-07-23 DIAGNOSIS — Z6841 Body Mass Index (BMI) 40.0 and over, adult: Secondary | ICD-10-CM

## 2023-07-23 DIAGNOSIS — D631 Anemia in chronic kidney disease: Secondary | ICD-10-CM | POA: Diagnosis present

## 2023-07-23 DIAGNOSIS — E871 Hypo-osmolality and hyponatremia: Secondary | ICD-10-CM | POA: Diagnosis present

## 2023-07-23 DIAGNOSIS — E1122 Type 2 diabetes mellitus with diabetic chronic kidney disease: Secondary | ICD-10-CM | POA: Diagnosis present

## 2023-07-23 DIAGNOSIS — A419 Sepsis, unspecified organism: Secondary | ICD-10-CM | POA: Diagnosis not present

## 2023-07-23 DIAGNOSIS — N3000 Acute cystitis without hematuria: Secondary | ICD-10-CM | POA: Diagnosis present

## 2023-07-23 DIAGNOSIS — N1832 Chronic kidney disease, stage 3b: Secondary | ICD-10-CM | POA: Diagnosis present

## 2023-07-23 DIAGNOSIS — E1165 Type 2 diabetes mellitus with hyperglycemia: Secondary | ICD-10-CM | POA: Diagnosis present

## 2023-07-23 DIAGNOSIS — D84821 Immunodeficiency due to drugs: Secondary | ICD-10-CM | POA: Diagnosis present

## 2023-07-23 DIAGNOSIS — Z7985 Long-term (current) use of injectable non-insulin antidiabetic drugs: Secondary | ICD-10-CM

## 2023-07-23 DIAGNOSIS — J45909 Unspecified asthma, uncomplicated: Secondary | ICD-10-CM | POA: Diagnosis present

## 2023-07-23 DIAGNOSIS — Z880 Allergy status to penicillin: Secondary | ICD-10-CM

## 2023-07-23 DIAGNOSIS — Z79624 Long term (current) use of inhibitors of nucleotide synthesis: Secondary | ICD-10-CM

## 2023-07-23 DIAGNOSIS — Z7982 Long term (current) use of aspirin: Secondary | ICD-10-CM

## 2023-07-23 DIAGNOSIS — N179 Acute kidney failure, unspecified: Secondary | ICD-10-CM | POA: Diagnosis present

## 2023-07-23 DIAGNOSIS — J9611 Chronic respiratory failure with hypoxia: Secondary | ICD-10-CM | POA: Diagnosis present

## 2023-07-23 DIAGNOSIS — D649 Anemia, unspecified: Secondary | ICD-10-CM

## 2023-07-23 DIAGNOSIS — F09 Unspecified mental disorder due to known physiological condition: Secondary | ICD-10-CM

## 2023-07-23 DIAGNOSIS — Z794 Long term (current) use of insulin: Secondary | ICD-10-CM

## 2023-07-23 DIAGNOSIS — M359 Systemic involvement of connective tissue, unspecified: Secondary | ICD-10-CM | POA: Diagnosis present

## 2023-07-23 DIAGNOSIS — N390 Urinary tract infection, site not specified: Secondary | ICD-10-CM

## 2023-07-23 DIAGNOSIS — Z818 Family history of other mental and behavioral disorders: Secondary | ICD-10-CM

## 2023-07-23 DIAGNOSIS — Z7984 Long term (current) use of oral hypoglycemic drugs: Secondary | ICD-10-CM

## 2023-07-23 DIAGNOSIS — Z888 Allergy status to other drugs, medicaments and biological substances status: Secondary | ICD-10-CM

## 2023-07-23 DIAGNOSIS — Z79890 Hormone replacement therapy (postmenopausal): Secondary | ICD-10-CM

## 2023-07-23 DIAGNOSIS — Z803 Family history of malignant neoplasm of breast: Secondary | ICD-10-CM

## 2023-07-23 DIAGNOSIS — Z66 Do not resuscitate: Secondary | ICD-10-CM | POA: Diagnosis present

## 2023-07-23 DIAGNOSIS — W010XXA Fall on same level from slipping, tripping and stumbling without subsequent striking against object, initial encounter: Secondary | ICD-10-CM | POA: Diagnosis present

## 2023-07-23 DIAGNOSIS — E66813 Obesity, class 3: Secondary | ICD-10-CM | POA: Diagnosis present

## 2023-07-23 DIAGNOSIS — Z7969 Long term (current) use of other immunomodulators and immunosuppressants: Secondary | ICD-10-CM

## 2023-07-23 DIAGNOSIS — Z7983 Long term (current) use of bisphosphonates: Secondary | ICD-10-CM

## 2023-07-23 HISTORY — DX: Pulmonary fibrosis, unspecified: J84.10

## 2023-07-23 HISTORY — DX: Obesity, unspecified: E66.9

## 2023-07-23 HISTORY — DX: Respiratory bronchiolitis interstitial lung disease: J84.115

## 2023-07-23 HISTORY — DX: Sleep apnea, unspecified: G47.30

## 2023-07-23 HISTORY — DX: Hyperlipidemia, unspecified: E78.5

## 2023-07-23 HISTORY — DX: Depression, unspecified: F32.A

## 2023-07-23 LAB — CBC WITH DIFFERENTIAL/PLATELET
Abs Immature Granulocytes: 0.14 10*3/uL — ABNORMAL HIGH (ref 0.00–0.07)
Basophils Absolute: 0 10*3/uL (ref 0.0–0.1)
Basophils Relative: 0 %
Eosinophils Absolute: 0 10*3/uL (ref 0.0–0.5)
Eosinophils Relative: 0 %
HCT: 30.7 % — ABNORMAL LOW (ref 36.0–46.0)
Hemoglobin: 8.9 g/dL — ABNORMAL LOW (ref 12.0–15.0)
Immature Granulocytes: 1 %
Lymphocytes Relative: 7 %
Lymphs Abs: 0.7 10*3/uL (ref 0.7–4.0)
MCH: 25.2 pg — ABNORMAL LOW (ref 26.0–34.0)
MCHC: 29 g/dL — ABNORMAL LOW (ref 30.0–36.0)
MCV: 87 fL (ref 80.0–100.0)
Monocytes Absolute: 1 10*3/uL (ref 0.1–1.0)
Monocytes Relative: 10 %
Neutro Abs: 8.4 10*3/uL — ABNORMAL HIGH (ref 1.7–7.7)
Neutrophils Relative %: 82 %
Platelets: 255 10*3/uL (ref 150–400)
RBC: 3.53 MIL/uL — ABNORMAL LOW (ref 3.87–5.11)
RDW: 16.2 % — ABNORMAL HIGH (ref 11.5–15.5)
WBC: 10.2 10*3/uL (ref 4.0–10.5)
nRBC: 0 % (ref 0.0–0.2)

## 2023-07-23 LAB — BASIC METABOLIC PANEL WITH GFR
Anion gap: 11 (ref 5–15)
BUN: 34 mg/dL — ABNORMAL HIGH (ref 8–23)
CO2: 27 mmol/L (ref 22–32)
Calcium: 8.8 mg/dL — ABNORMAL LOW (ref 8.9–10.3)
Chloride: 93 mmol/L — ABNORMAL LOW (ref 98–111)
Creatinine, Ser: 1.77 mg/dL — ABNORMAL HIGH (ref 0.44–1.00)
GFR, Estimated: 31 mL/min — ABNORMAL LOW (ref >60)
Glucose, Bld: 326 mg/dL — ABNORMAL HIGH (ref 70–99)
Potassium: 4.7 mmol/L (ref 3.5–5.1)
Sodium: 131 mmol/L — ABNORMAL LOW (ref 135–145)

## 2023-07-23 LAB — LACTIC ACID, PLASMA: Lactic Acid, Venous: 3.2 mmol/L (ref 0.5–1.9)

## 2023-07-23 LAB — CBG MONITORING, ED: Glucose-Capillary: 322 mg/dL — ABNORMAL HIGH (ref 70–99)

## 2023-07-23 LAB — TROPONIN I (HIGH SENSITIVITY): Troponin I (High Sensitivity): 17 ng/L (ref <18)

## 2023-07-23 MED ORDER — SODIUM CHLORIDE 0.9 % IV SOLN
1.0000 g | Freq: Once | INTRAVENOUS | Status: AC
  Administered 2023-07-23: 1 g via INTRAVENOUS
  Filled 2023-07-23: qty 10

## 2023-07-23 MED ORDER — LACTATED RINGERS IV BOLUS
1000.0000 mL | Freq: Once | INTRAVENOUS | Status: AC
  Administered 2023-07-23: 1000 mL via INTRAVENOUS

## 2023-07-23 NOTE — ED Provider Notes (Signed)
-----------------------------------------   11:38 PM on 07/23/2023 -----------------------------------------  Blood pressure 114/62, pulse (!) 103, temperature 98.7 F (37.1 C), temperature source Oral, resp. rate 20, height 5' (1.524 m), weight 112.6 kg, SpO2 100%.  Assuming care from Dr. Derrill Kay.  In short, Denise Knapp is a 68 y.o. female with a chief complaint of Fall .  Refer to the original H&P for additional details.  The current plan of care is to follow-up UA results for suspected UTI and generalized weakness with fall.  ----------------------------------------- 2:55 AM on 07/24/2023 ----------------------------------------- Urinalysis consistent with UTI, which appears to be the source of her sepsis.  Lactic acid improving following IV fluids, case discussed with hospitalist for admission.       Chesley Noon, MD 07/24/23 [phone]

## 2023-07-23 NOTE — ED Provider Notes (Signed)
 [facility] Provider Note    Event Date/Time   First MD Initiated Contact with Patient 07/23/23 2138     (approximate)   History   Weakness   HPI  Denise Knapp is a 68 y.o. female   who presents to the emergency department today because of concerns for weakness.  Patient states that she has been feeling progressively weak over the past few days.  She states she has also been having some vaginal discomfort.  She was tested for UTI and states that it was positive although has not yet started antibiotics.  Today when she was in the bathroom she had a fall.  She is not sure if she slipped or if she fell because of the weakness.  She denies any injuries or pain after the fall.      Physical Exam   Triage Vital Signs: ED Triage Vitals  Encounter Vitals Group     BP 07/23/23 2141 (!) 121/47     Systolic BP Percentile --      Diastolic BP Percentile --      Pulse Rate 07/23/23 2141 (!) 112     Resp 07/23/23 2141 20     Temp 07/23/23 2141 98.7 F (37.1 C)     Temp Source 07/23/23 2141 Oral     SpO2 07/23/23 2141 97 %     Weight 07/23/23 2142 248 lb 4.8 oz (112.6 kg)     Height 07/23/23 2142 5' (1.524 m)     Head Circumference --      Peak Flow --      Pain Score 07/23/23 2142 7     Pain Loc --      Pain Education --      Exclude from Growth Chart --     Most recent vital signs: Vitals:   07/23/23 2141  BP: (!) 121/47  Pulse: (!) 112  Resp: 20  Temp: 98.7 F (37.1 C)  SpO2: 97%   General: Awake, alert, oriented. CV:  Good peripheral perfusion. Regular rate and rhythm. Resp:  Normal effort. Lungs clear. Abd:  No distention.    ED Results / Procedures / Treatments   Labs (all labs ordered are listed, but only abnormal results are displayed) Labs Reviewed  CBG MONITORING, ED - Abnormal; Notable for the following components:      Result Value   Glucose-Capillary 322 (*)    All other components within normal limits  CULTURE, BLOOD (ROUTINE  X 2)  CULTURE, BLOOD (ROUTINE X 2)  CBC WITH DIFFERENTIAL/PLATELET  BASIC METABOLIC PANEL WITH GFR  URINALYSIS, ROUTINE W REFLEX MICROSCOPIC  LACTIC ACID, PLASMA  LACTIC ACID, PLASMA  TROPONIN I (HIGH SENSITIVITY)     EKG  I, Phineas Semen, attending physician, personally viewed and interpreted this EKG  EKG Time: 2249 Rate: 103 Rhythm: sinus tachycardia Axis: left axis deviation Intervals: qtc 394 QRS: low voltage ST changes: no st elevation Impression: abnormal ekg    RADIOLOGY None  PROCEDURES:  Critical Care performed: No    MEDICATIONS ORDERED IN ED: Medications - No data to display   IMPRESSION / MDM / ASSESSMENT AND PLAN / ED COURSE  I reviewed the triage vital signs and the nursing notes.                              Differential diagnosis includes, but is not limited to, anemia, dehydration, UTI, pneumonia  Patient's presentation is  most consistent with acute presentation with potential threat to life or bodily function.  Patient presented to the emergency department today with concerns for weakness.  Patient does states she was recently diagnosed with UTI although has not started antibiotics.  Did have a fall tonight although denies any traumatic injuries.  Will check blood work and urine to begin work up for weakness. Will give IV fluids.     FINAL CLINICAL IMPRESSION(S) / ED DIAGNOSES   Final diagnoses:  Weakness     Note:  This document was prepared using Dragon voice recognition software and may include unintentional dictation errors.    Phineas Semen, MD 07/23/23 [id number]

## 2023-07-23 NOTE — ED Notes (Signed)
 Pt aware urinalysis needed, call bell within reach to call out when able to void

## 2023-07-23 NOTE — ED Triage Notes (Addendum)
 Pt BIB [facility] from [facility] post fall after slipping on rug while going to the BR, denies LOC and blood thinners, recent diagnosis of UTI   EMS v/s: 154/81, HR 120, 98.6, RR20, pt on 4L [place] at home

## 2023-07-24 ENCOUNTER — Observation Stay

## 2023-07-24 DIAGNOSIS — R6520 Severe sepsis without septic shock: Secondary | ICD-10-CM | POA: Diagnosis present

## 2023-07-24 DIAGNOSIS — F32A Depression, unspecified: Secondary | ICD-10-CM | POA: Diagnosis present

## 2023-07-24 DIAGNOSIS — Z9981 Dependence on supplemental oxygen: Secondary | ICD-10-CM | POA: Diagnosis not present

## 2023-07-24 DIAGNOSIS — E66813 Obesity, class 3: Secondary | ICD-10-CM | POA: Diagnosis present

## 2023-07-24 DIAGNOSIS — J8410 Pulmonary fibrosis, unspecified: Secondary | ICD-10-CM | POA: Diagnosis present

## 2023-07-24 DIAGNOSIS — E871 Hypo-osmolality and hyponatremia: Secondary | ICD-10-CM | POA: Diagnosis present

## 2023-07-24 DIAGNOSIS — Z794 Long term (current) use of insulin: Secondary | ICD-10-CM | POA: Diagnosis not present

## 2023-07-24 DIAGNOSIS — E785 Hyperlipidemia, unspecified: Secondary | ICD-10-CM | POA: Diagnosis present

## 2023-07-24 DIAGNOSIS — G4733 Obstructive sleep apnea (adult) (pediatric): Secondary | ICD-10-CM | POA: Diagnosis present

## 2023-07-24 DIAGNOSIS — D631 Anemia in chronic kidney disease: Secondary | ICD-10-CM | POA: Diagnosis present

## 2023-07-24 DIAGNOSIS — W010XXA Fall on same level from slipping, tripping and stumbling without subsequent striking against object, initial encounter: Secondary | ICD-10-CM | POA: Diagnosis present

## 2023-07-24 DIAGNOSIS — Z7984 Long term (current) use of oral hypoglycemic drugs: Secondary | ICD-10-CM | POA: Diagnosis not present

## 2023-07-24 DIAGNOSIS — N179 Acute kidney failure, unspecified: Secondary | ICD-10-CM

## 2023-07-24 DIAGNOSIS — J45909 Unspecified asthma, uncomplicated: Secondary | ICD-10-CM | POA: Diagnosis present

## 2023-07-24 DIAGNOSIS — Z66 Do not resuscitate: Secondary | ICD-10-CM | POA: Diagnosis present

## 2023-07-24 DIAGNOSIS — E1165 Type 2 diabetes mellitus with hyperglycemia: Secondary | ICD-10-CM

## 2023-07-24 DIAGNOSIS — N3000 Acute cystitis without hematuria: Secondary | ICD-10-CM | POA: Diagnosis present

## 2023-07-24 DIAGNOSIS — R531 Weakness: Secondary | ICD-10-CM | POA: Diagnosis present

## 2023-07-24 DIAGNOSIS — N1831 Chronic kidney disease, stage 3a: Secondary | ICD-10-CM | POA: Insufficient documentation

## 2023-07-24 DIAGNOSIS — N1832 Chronic kidney disease, stage 3b: Secondary | ICD-10-CM | POA: Diagnosis present

## 2023-07-24 DIAGNOSIS — Z7969 Long term (current) use of other immunomodulators and immunosuppressants: Secondary | ICD-10-CM

## 2023-07-24 DIAGNOSIS — D649 Anemia, unspecified: Secondary | ICD-10-CM

## 2023-07-24 DIAGNOSIS — A419 Sepsis, unspecified organism: Principal | ICD-10-CM

## 2023-07-24 DIAGNOSIS — N390 Urinary tract infection, site not specified: Secondary | ICD-10-CM | POA: Diagnosis not present

## 2023-07-24 DIAGNOSIS — J9611 Chronic respiratory failure with hypoxia: Secondary | ICD-10-CM | POA: Diagnosis present

## 2023-07-24 DIAGNOSIS — E1122 Type 2 diabetes mellitus with diabetic chronic kidney disease: Secondary | ICD-10-CM | POA: Diagnosis present

## 2023-07-24 DIAGNOSIS — Z515 Encounter for palliative care: Secondary | ICD-10-CM | POA: Diagnosis not present

## 2023-07-24 DIAGNOSIS — E875 Hyperkalemia: Secondary | ICD-10-CM | POA: Diagnosis present

## 2023-07-24 DIAGNOSIS — D84821 Immunodeficiency due to drugs: Secondary | ICD-10-CM | POA: Diagnosis present

## 2023-07-24 DIAGNOSIS — Z6841 Body Mass Index (BMI) 40.0 and over, adult: Secondary | ICD-10-CM | POA: Diagnosis not present

## 2023-07-24 LAB — URINALYSIS, ROUTINE W REFLEX MICROSCOPIC
Bilirubin Urine: NEGATIVE
Glucose, UA: 50 mg/dL — AB
Hgb urine dipstick: NEGATIVE
Ketones, ur: NEGATIVE mg/dL
Nitrite: NEGATIVE
Protein, ur: 30 mg/dL — AB
RBC / HPF: 0 RBC/hpf (ref 0–5)
Specific Gravity, Urine: 1.014 (ref 1.005–1.030)
WBC, UA: >50 WBC/hpf (ref 0–5)
pH: 5 (ref 5.0–8.0)

## 2023-07-24 LAB — BLOOD GAS, ARTERIAL
Acid-Base Excess: 2.8 mmol/L — ABNORMAL HIGH (ref 0.0–2.0)
Bicarbonate: 29.4 mmol/L — ABNORMAL HIGH (ref 20.0–28.0)
O2 Saturation: 98.2 %
Patient temperature: 37
pCO2 arterial: 52 mmHg — ABNORMAL HIGH (ref 32–48)
pH, Arterial: 7.36 (ref 7.35–7.45)
pO2, Arterial: 197 mmHg — ABNORMAL HIGH (ref 83–108)

## 2023-07-24 LAB — CBC WITH DIFFERENTIAL/PLATELET
Abs Immature Granulocytes: 0.16 10*3/uL — ABNORMAL HIGH (ref 0.00–0.07)
Basophils Absolute: 0 10*3/uL (ref 0.0–0.1)
Basophils Relative: 0 %
Eosinophils Absolute: 0 10*3/uL (ref 0.0–0.5)
Eosinophils Relative: 0 %
HCT: 28.8 % — ABNORMAL LOW (ref 36.0–46.0)
Hemoglobin: 8.7 g/dL — ABNORMAL LOW (ref 12.0–15.0)
Immature Granulocytes: 1 %
Lymphocytes Relative: 10 %
Lymphs Abs: 1.1 10*3/uL (ref 0.7–4.0)
MCH: 26 pg (ref 26.0–34.0)
MCHC: 30.2 g/dL (ref 30.0–36.0)
MCV: 86.2 fL (ref 80.0–100.0)
Monocytes Absolute: 1.4 10*3/uL — ABNORMAL HIGH (ref 0.1–1.0)
Monocytes Relative: 13 %
Neutro Abs: 8.4 10*3/uL — ABNORMAL HIGH (ref 1.7–7.7)
Neutrophils Relative %: 76 %
Platelets: 258 10*3/uL (ref 150–400)
RBC: 3.34 MIL/uL — ABNORMAL LOW (ref 3.87–5.11)
RDW: 16.5 % — ABNORMAL HIGH (ref 11.5–15.5)
WBC: 11 10*3/uL — ABNORMAL HIGH (ref 4.0–10.5)
nRBC: 0 % (ref 0.0–0.2)

## 2023-07-24 LAB — RESP PANEL BY RT-PCR (RSV, FLU A&B, COVID)  RVPGX2
Influenza A by PCR: NEGATIVE
Influenza B by PCR: NEGATIVE
Resp Syncytial Virus by PCR: NEGATIVE
SARS Coronavirus 2 by RT PCR: NEGATIVE

## 2023-07-24 LAB — COMPREHENSIVE METABOLIC PANEL WITH GFR
ALT: 37 U/L (ref 0–44)
AST: 44 U/L — ABNORMAL HIGH (ref 15–41)
Albumin: 3 g/dL — ABNORMAL LOW (ref 3.5–5.0)
Alkaline Phosphatase: 66 U/L (ref 38–126)
Anion gap: 9 (ref 5–15)
BUN: 28 mg/dL — ABNORMAL HIGH (ref 8–23)
CO2: 29 mmol/L (ref 22–32)
Calcium: 8.7 mg/dL — ABNORMAL LOW (ref 8.9–10.3)
Chloride: 92 mmol/L — ABNORMAL LOW (ref 98–111)
Creatinine, Ser: 1.47 mg/dL — ABNORMAL HIGH (ref 0.44–1.00)
GFR, Estimated: 38 mL/min — ABNORMAL LOW (ref >60)
Glucose, Bld: 453 mg/dL — ABNORMAL HIGH (ref 70–99)
Potassium: 5.2 mmol/L — ABNORMAL HIGH (ref 3.5–5.1)
Sodium: 130 mmol/L — ABNORMAL LOW (ref 135–145)
Total Bilirubin: 0.3 mg/dL (ref 0.0–1.2)
Total Protein: 7 g/dL (ref 6.5–8.1)

## 2023-07-24 LAB — LACTIC ACID, PLASMA
Lactic Acid, Venous: 2.3 mmol/L (ref 0.5–1.9)
Lactic Acid, Venous: 2.1 mmol/L (ref 0.5–1.9)

## 2023-07-24 LAB — PROTIME-INR
INR: 1.1 (ref 0.8–1.2)
Prothrombin Time: 13.9 s (ref 11.4–15.2)

## 2023-07-24 LAB — TROPONIN I (HIGH SENSITIVITY): Troponin I (High Sensitivity): 20 ng/L — ABNORMAL HIGH (ref <18)

## 2023-07-24 LAB — CORTISOL-AM, BLOOD: Cortisol - AM: 16.7 ug/dL (ref 6.7–22.6)

## 2023-07-24 LAB — GLUCOSE, CAPILLARY
Glucose-Capillary: 390 mg/dL — ABNORMAL HIGH (ref 70–99)
Glucose-Capillary: 338 mg/dL — ABNORMAL HIGH (ref 70–99)

## 2023-07-24 LAB — PROCALCITONIN: Procalcitonin: 0.94 ng/mL

## 2023-07-24 LAB — HIV ANTIBODY (ROUTINE TESTING W REFLEX): HIV Screen 4th Generation wRfx: NONREACTIVE

## 2023-07-24 LAB — CBG MONITORING, ED: Glucose-Capillary: 384 mg/dL — ABNORMAL HIGH (ref 70–99)

## 2023-07-24 MED ORDER — ENALAPRIL MALEATE 2.5 MG PO TABS
2.5000 mg | ORAL_TABLET | Freq: Every day | ORAL | Status: DC
  Filled 2023-07-24: qty 1

## 2023-07-24 MED ORDER — ENSURE ENLIVE PO LIQD
237.0000 mL | Freq: Two times a day (BID) | ORAL | Status: DC
  Administered 2023-07-25: 237 mL via ORAL

## 2023-07-24 MED ORDER — LACTATED RINGERS IV SOLN
150.0000 mL/h | INTRAVENOUS | Status: AC
  Administered 2023-07-24 (×2): 150 mL/h via INTRAVENOUS

## 2023-07-24 MED ORDER — ACETAMINOPHEN 325 MG PO TABS
650.0000 mg | ORAL_TABLET | Freq: Four times a day (QID) | ORAL | Status: DC | PRN
  Administered 2023-07-24: 650 mg via ORAL
  Filled 2023-07-24 (×2): qty 2

## 2023-07-24 MED ORDER — SODIUM CHLORIDE 0.9 % IV SOLN
2.0000 g | INTRAVENOUS | Status: DC
  Administered 2023-07-25: 2 g via INTRAVENOUS
  Filled 2023-07-24: qty 20

## 2023-07-24 MED ORDER — MORPHINE SULFATE (PF) 2 MG/ML IV SOLN
2.0000 mg | INTRAVENOUS | Status: DC | PRN
  Administered 2023-07-24: 2 mg via INTRAVENOUS
  Filled 2023-07-24: qty 1

## 2023-07-24 MED ORDER — ACETAMINOPHEN 650 MG RE SUPP: 650.0000 mg | Freq: Four times a day (QID) | RECTAL | Status: DC | PRN

## 2023-07-24 MED ORDER — DONEPEZIL HCL 5 MG PO TABS
5.0000 mg | ORAL_TABLET | Freq: Every day | ORAL | Status: DC
  Administered 2023-07-24: 5 mg via ORAL
  Filled 2023-07-24: qty 1

## 2023-07-24 MED ORDER — INSULIN ASPART 100 UNIT/ML IJ SOLN
0.0000 [IU] | Freq: Three times a day (TID) | INTRAMUSCULAR | Status: DC
  Administered 2023-07-24 – 2023-07-25 (×2): 15 [IU] via SUBCUTANEOUS
  Filled 2023-07-24 (×2): qty 1

## 2023-07-24 MED ORDER — HYDROCODONE-ACETAMINOPHEN 5-325 MG PO TABS
1.0000 | ORAL_TABLET | ORAL | Status: DC | PRN
  Administered 2023-07-24: 1 via ORAL
  Filled 2023-07-24: qty 1

## 2023-07-24 MED ORDER — PRAVASTATIN SODIUM 20 MG PO TABS
20.0000 mg | ORAL_TABLET | Freq: Every day | ORAL | Status: DC
  Administered 2023-07-24: 20 mg via ORAL
  Filled 2023-07-24: qty 1

## 2023-07-24 MED ORDER — MORPHINE SULFATE (PF) 4 MG/ML IV SOLN
4.0000 mg | Freq: Once | INTRAVENOUS | Status: AC
  Administered 2023-07-24: 4 mg via INTRAVENOUS
  Filled 2023-07-24: qty 1

## 2023-07-24 MED ORDER — INSULIN ASPART 100 UNIT/ML IJ SOLN
0.0000 [IU] | Freq: Every day | INTRAMUSCULAR | Status: DC
  Administered 2023-07-24: 5 [IU] via SUBCUTANEOUS
  Filled 2023-07-24: qty 1

## 2023-07-24 MED ORDER — INSULIN GLARGINE-YFGN 100 UNIT/ML ~~LOC~~ SOLN
30.0000 [IU] | Freq: Every day | SUBCUTANEOUS | Status: DC
  Administered 2023-07-24: 30 [IU] via SUBCUTANEOUS
  Filled 2023-07-24 (×2): qty 0.3

## 2023-07-24 MED ORDER — SODIUM CHLORIDE 0.9 % IV SOLN
1.0000 g | Freq: Once | INTRAVENOUS | Status: AC
  Administered 2023-07-24: 1 g via INTRAVENOUS
  Filled 2023-07-24: qty 10

## 2023-07-24 MED ORDER — LEVOTHYROXINE SODIUM 137 MCG PO TABS
137.0000 ug | ORAL_TABLET | Freq: Every day | ORAL | Status: DC
  Administered 2023-07-24 – 2023-07-25 (×2): 137 ug via ORAL
  Filled 2023-07-24 (×2): qty 1

## 2023-07-24 MED ORDER — ENOXAPARIN SODIUM 60 MG/0.6ML IJ SOSY
0.5000 mg/kg | PREFILLED_SYRINGE | INTRAMUSCULAR | Status: DC
  Administered 2023-07-24 – 2023-07-25 (×2): 57.5 mg via SUBCUTANEOUS
  Filled 2023-07-24 (×2): qty 0.6

## 2023-07-24 MED ORDER — FLUOXETINE HCL 20 MG PO CAPS
40.0000 mg | ORAL_CAPSULE | Freq: Every day | ORAL | Status: DC
  Administered 2023-07-24 – 2023-07-25 (×2): 40 mg via ORAL
  Filled 2023-07-24 (×2): qty 2

## 2023-07-24 MED ORDER — ONDANSETRON HCL 4 MG/2ML IJ SOLN: 4.0000 mg | Freq: Four times a day (QID) | INTRAMUSCULAR | Status: DC | PRN

## 2023-07-24 MED ORDER — ONDANSETRON HCL 4 MG PO TABS: 4.0000 mg | ORAL_TABLET | Freq: Four times a day (QID) | ORAL | Status: DC | PRN

## 2023-07-24 MED ORDER — RISPERIDONE 0.5 MG PO TABS
2.0000 mg | ORAL_TABLET | Freq: Every day | ORAL | Status: DC
  Administered 2023-07-24: 2 mg via ORAL
  Filled 2023-07-24: qty 4

## 2023-07-24 MED ORDER — BUPROPION HCL ER (XL) 300 MG PO TB24
300.0000 mg | ORAL_TABLET | Freq: Every day | ORAL | Status: DC
  Administered 2023-07-24 – 2023-07-25 (×2): 300 mg via ORAL
  Filled 2023-07-24: qty 1
  Filled 2023-07-24: qty 2

## 2023-07-24 NOTE — Assessment & Plan Note (Signed)
 Possible sepsis At risk of severe infection due to chronic immunosuppression Borderline sepsis criteria include tachycardia with lactic acidosis and AKI, UTI Will get CXR and respiratory viral panel to evaluate for respiratory tract infection.  Patient presented with weakness only IV Rocephin Continue sepsis fluids Follow cultures

## 2023-07-24 NOTE — Assessment & Plan Note (Addendum)
 Depression Continue home meds, Risperdal, Aricept, fluoxetine and bupropion Delirium precautions

## 2023-07-24 NOTE — ED Notes (Signed)
 Dr Myriam Forehand notified of pt being slightly confused during breakfast. Pt has been ANOx4 all day today but she had an episode where she was holding a plate of food on her lap but was unaware of it. Was able to be redirected. This RN asked the pt if she had vision problems and she stated she was near sighted.

## 2023-07-24 NOTE — Progress Notes (Addendum)
 Progress Note    Denise Knapp  WGN:[id number] DOB: 1955/05/21  DOA: 07/23/2023 PCP: Eartha Gold, MD      Brief Narrative:    Medical records reviewed and are as summarized below:  Denise Knapp is a 68 y.o. 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  Reportedly, she fell after slipping on a rug while going to the bathroom.  She has a complaint of general weakness for the past few days prior to admission.  Centrally diagnosed with UTI at bedside had not started antibiotics.  She was admitted to the hospital for severe sepsis secondary to acute UTI.         Assessment/Plan:   Principal Problem:   Severe sepsis (HCC) Active Problems:   Urinary tract infection without hematuria   Pulmonary fibrosis, unspecified (HCC)   Uncontrolled type 2 diabetes mellitus with hyperglycemia, with long-term current use of insulin (HCC)   Chronic respiratory failure with hypoxia (HCC)   OSA (obstructive sleep apnea)   Acute renal failure superimposed on stage 3a chronic kidney disease (HCC)   Long term (current) use of other immunomodulators and immunosuppressants   Anemia   Undifferentiated connective tissue disease (HCC)   Obesity, Class III, BMI 40-49.9 (morbid obesity) (HCC)   Mild cognitive disorder   Acute cystitis    Body mass index is 48.49 kg/m.  (Morbid obesity).  This complicates overall care and prognosis.    Fever, severe sepsis secondary to acute UTI, immunocompromised: She had fever with Tmax of 102 F today.  Continue IV ceftriaxone.  Follow-up blood and urine cultures. Lactic acid down from 3.2-2.3-2.1   Type II DM with severe hyperglycemia:  Glucose up to 453.  Start Lantus 30 units daily.  Use NovoLog as needed for hyperglycemia. Hemoglobin A1c is pending   AKI on CKD stage IIIb: Continue IV fluids. Hyperkalemia: Expected to improve with IV fluids and insulin.  Monitor BMP Hyponatremia: Continue IV fluids   Pulmonary fibrosis/ILD, OSA, chronic hypoxic respiratory failure: She is on 4 L/min oxygen via .   Bronchodilators as needed.   Undifferentiated connective tissue disease: CellCept and prednisone have been held because of acute infection.   Comorbidities include depression, mild cognitive impairment   Diet Order             Diet heart healthy/carb modified Room service appropriate? Yes; Fluid consistency: Thin  Diet effective now                            Consultants: None  Procedures: None    Medications:    buPROPion  300 mg Oral Q breakfast   donepezil  5 mg Oral QHS   enoxaparin (LOVENOX) injection  0.5 mg/kg Subcutaneous Q24H   [START ON 07/25/2023] feeding supplement  237 mL Oral BID BM   FLUoxetine  40 mg Oral Daily   insulin aspart  0-15 Units Subcutaneous TID WC   insulin aspart  0-5 Units Subcutaneous QHS   insulin glargine-yfgn  30 Units Subcutaneous Daily   levothyroxine  137 mcg Oral Q0600   pravastatin  20 mg Oral q1800   risperiDONE  2 mg Oral QHS   Continuous Infusions:  [START ON 07/25/2023]  cefTRIAXone (ROCEPHIN)  IV     lactated ringers 150 mL/hr (07/24/23 0559)     Anti-infectives (From admission, onward)    Start     Dose/Rate Route Frequency Ordered Stop   07/25/23 0000  cefTRIAXone (ROCEPHIN) 2 g in sodium chloride 0.9 % 100 mL IVPB        2 g 200 mL/hr over 30 Minutes Intravenous Every 24 hours 07/24/23 0348 07/30/23 2359   07/24/23 0400  cefTRIAXone (ROCEPHIN) 1 g in sodium chloride 0.9 % 100 mL IVPB        1 g 200 mL/hr over 30 Minutes Intravenous  Once 07/24/23 0353 07/24/23 0524   07/23/23 2345  cefTRIAXone (ROCEPHIN) 1 g in sodium chloride 0.9 %  100 mL IVPB        1 g 200 mL/hr over 30 Minutes Intravenous  Once 07/23/23 2332 07/24/23 0018              Family Communication/Anticipated D/C date and plan/Code Status   DVT prophylaxis:      Code Status: Full Code  Family Communication: None Disposition Plan: Plan to discharge to ALF   Status is: Inpatient Remains inpatient appropriate because: Sepsis from UTI         Subjective:   Interval events noted.  She complains of painful urination and general weakness.  No other complaints.  Objective:    Vitals:   07/24/23 1144 07/24/23 1200 07/24/23 1254 07/24/23 1402  BP:  (!) 129/52  133/62  Pulse:  (!) 107  (!) 105  Resp:  (!) 26  (!) 24  Temp: 99.6 F (37.6 C)  (!) 100.5 F (38.1 C) 99.1 F (37.3 C)  TempSrc: Oral  Oral Oral  SpO2:  97%  95%  Weight:      Height:       No data found.  No intake or output data in the 24 hours ending 07/24/23 1432 Filed Weights   07/23/23 2142  Weight: 112.6 kg    Exam:  GEN: NAD SKIN: Warm and dry EYES: No pallor or icterus ENT: MMM CV: RRR PULM: Bibasilar rales ABD: soft, obese, NT, +BS CNS: AAO x 3, non focal EXT: No edema or tenderness        Data Reviewed:   I have personally reviewed following labs and imaging studies:  Labs: Labs show the following:   Basic Metabolic Panel: Recent Labs  Lab 07/23/23 2221 07/24/23 1031  NA 131* 130*  K 4.7 5.2*  CL 93* 92*  CO2 27 29  GLUCOSE 326* 453*  BUN 34* 28*  CREATININE 1.77* 1.47*  CALCIUM 8.8* 8.7*   GFR Estimated Creatinine Clearance: 41.2 mL/min (A) (by C-G formula based on SCr of 1.47 mg/dL (H)). Liver Function Tests: Recent Labs  Lab 07/24/23 1031  AST 44*  ALT 37  ALKPHOS 66  BILITOT 0.3  PROT 7.0  ALBUMIN 3.0*   No results for input(s): "LIPASE", "AMYLASE" in the last 168 hours. No results for input(s): "AMMONIA" in the last 168 hours. Coagulation profile Recent Labs  Lab 07/24/23 0559  INR 1.1     CBC: Recent Labs  Lab 07/23/23 2221 07/24/23 1031  WBC 10.2 11.0*  NEUTROABS 8.4* 8.4*  HGB 8.9* 8.7*  HCT 30.7* 28.8*  MCV 87.0 86.2  PLT 255 258   Cardiac Enzymes: No results for input(s): "CKTOTAL", "CKMB", "CKMBINDEX", "TROPONINI" in the last 168 hours. BNP (last 3 results) No results for input(s): "PROBNP" in the last 8760 hours. CBG:  Recent Labs  Lab 07/23/23 2144 07/24/23 1216  GLUCAP 322* 384*   D-Dimer: No results for input(s): "DDIMER" in the last 72 hours. Hgb A1c: No results for input(s): "HGBA1C" in the last 72 hours. Lipid Profile: No results for input(s): "CHOL", "HDL", "LDLCALC", "TRIG", "CHOLHDL", "LDLDIRECT" in the last 72 hours. Thyroid function studies: No results for input(s): "TSH", "T4TOTAL", "T3FREE", "THYROIDAB" in the last 72 hours.  Invalid input(s): "FREET3" Anemia work up: No results for input(s): "VITAMINB12", "FOLATE", "FERRITIN", "TIBC", "IRON", "RETICCTPCT" in the last 72 hours. Sepsis Labs: Recent Labs  Lab 07/23/23 2221 07/24/23 0213 07/24/23 1031  PROCALCITON  --   --  0.94  WBC 10.2  --  11.0*  LATICACIDVEN 3.2* 2.3* 2.1*    Microbiology Recent Results (from the past 240 hours)  Blood culture (routine x 2)     Status: None (Preliminary result)   Collection Time: 07/23/23 10:30 PM   Specimen: BLOOD  Result Value Ref Range Status   Specimen Description BLOOD BLOOD LEFT FOREARM  Final   Special Requests   Final    BOTTLES DRAWN AEROBIC AND ANAEROBIC Blood Culture results may not be optimal due to an excessive volume of blood received in culture bottles   Culture   Final    NO GROWTH < 12 HOURS Performed at [facility], [address], [place], [place] [address]    Report Status PENDING  Incomplete  Resp panel by RT-PCR (RSV, Flu A&B, Covid) Anterior Nasal Swab     Status: None   Collection Time: 07/24/23  3:45 AM   Specimen: Anterior Nasal Swab  Result Value Ref Range Status   SARS Coronavirus 2 by RT  PCR NEGATIVE NEGATIVE Final    Comment: (NOTE) SARS-CoV-2 target nucleic acids are NOT DETECTED.  The SARS-CoV-2 RNA is generally detectable in upper respiratory specimens during the acute phase of infection. The lowest concentration of SARS-CoV-2 viral copies this assay can detect is 138 copies/mL. A negative result does not preclude SARS-Cov-2 infection and should not be used as the sole basis for treatment or other patient management decisions. A negative result may occur with  improper specimen collection/handling, submission of specimen other than nasopharyngeal swab, presence of viral mutation(s) within the areas targeted by this assay, and inadequate number of viral copies(<138 copies/mL). A negative result must be combined with clinical observations, patient history, and epidemiological information. The expected result is Negative.  Fact Sheet for Patients:  BloggerCourse.com  Fact Sheet for Healthcare Providers:  SeriousBroker.it  This test is no t yet approved or cleared by the [place]  FDA and  has been authorized for detection and/or diagnosis of SARS-CoV-2 by FDA under an Emergency Use Authorization (EUA). This EUA will remain  in effect (meaning this test can be used) for the duration of the COVID-19 declaration under Section 564(b)(1) of the Act, 21 U.S.C.section 360bbb-3(b)(1), unless the authorization is terminated  or revoked sooner.       Influenza A by PCR NEGATIVE NEGATIVE Final   Influenza B by PCR NEGATIVE NEGATIVE Final    Comment: (NOTE) The Xpert Xpress SARS-CoV-2/FLU/RSV plus assay is intended as an aid in the diagnosis of influenza from Nasopharyngeal swab specimens and should not be used as a sole basis for treatment. Nasal washings and aspirates are unacceptable for Xpert Xpress SARS-CoV-2/FLU/RSV testing.  Fact Sheet for Patients: BloggerCourse.com  Fact Sheet for  Healthcare Providers: SeriousBroker.it  This test is not yet approved or cleared by the [place]  FDA and  has been authorized for detection and/or diagnosis of SARS-CoV-2 by FDA under an Emergency Use Authorization (EUA). This EUA will remain in effect (meaning this test can be used) for the duration of the COVID-19 declaration under Section 564(b)(1) of the Act, 21 U.S.C. section 360bbb-3(b)(1), unless the authorization is terminated or revoked.     Resp Syncytial Virus by PCR NEGATIVE NEGATIVE Final    Comment: (NOTE) Fact Sheet for Patients: BloggerCourse.com  Fact Sheet for Healthcare Providers: SeriousBroker.it  This test is not yet approved or cleared by the [place]  FDA and has been authorized for detection and/or diagnosis of SARS-CoV-2 by FDA under an Emergency Use Authorization (EUA). This EUA will remain in effect (meaning this test can be used) for the duration of the COVID-19 declaration under Section 564(b)(1) of the Act, 21 U.S.C. section 360bbb-3(b)(1), unless the authorization is terminated or revoked.  Performed at [facility], [address] [place], [place] [address]     Procedures and diagnostic studies:  DG Chest [facility] 1 View Result Date: 07/24/2023 CLINICAL DATA:  Tachycardia EXAM: PORTABLE CHEST 1 VIEW COMPARISON:  09/03/2021 FINDINGS: Cardiomegaly with vascular congestion. Mild diffuse interstitial and hazy pulmonary density, possible edema. No pleural effusion or pneumothorax. Aortic atherosclerosis IMPRESSION: Cardiomegaly with vascular congestion and possible mild edema. Electronically Signed   By: Esmeralda Hedge M.D.   On: 07/24/2023 03:49               LOS: 0 days   Denise Knapp  [facility]   Pager on [email or web address]. If 7PM-7AM, please contact night-coverage at [email or web address]     07/24/2023, 2:32 PM

## 2023-07-24 NOTE — Assessment & Plan Note (Addendum)
 Marland Kitchen

## 2023-07-24 NOTE — ED Notes (Signed)
 Pt sat up in bed and assisted with cutting up her food

## 2023-07-24 NOTE — Assessment & Plan Note (Signed)
 On long-term immunosuppression with CellCept and prednisone Not acutely flared Holding immunosuppressants due to acute infection

## 2023-07-24 NOTE — Inpatient Diabetes Management (Signed)
 Inpatient Diabetes Program Recommendations  AACE/ADA: New Consensus Statement on Inpatient Glycemic Control (2015)  Target Ranges:  Prepandial:   less than 140 mg/dL      Peak postprandial:   less than 180 mg/dL (1-2 hours)      Critically ill patients:  140 - 180 mg/dL   Lab Results  Component Value Date   GLUCAP 322 (H) 07/23/2023    Review of Glycemic Control  Latest Reference Range & Units 07/23/23 21:44  Glucose-Capillary 70 - 99 mg/dL 161 (H)  (H): Data is abnormally high Diabetes history: Type 2 DM Outpatient Diabetes medications: Amaryl 4 mg every day, Humalog 0-15 units TID, Lantus 36 units BID Current orders for Inpatient glycemic control: none  Inpatient Diabetes Program Recommendations:    Consider adding Semglee 30 units every day and Novolog 0-9 units TID & HS. Secure chat sent to MD.   Thanks, Lujean Rave, MSN, RNC-OB Diabetes Coordinator [phone] (8a-5p)

## 2023-07-24 NOTE — ED Notes (Addendum)
 Pharmacy called and asked about pts insulin. This RN noted blood glucose was high in recent labs and saw the H&P note included sliding scale insulin coverage. Pharmacist said she would message the provider.

## 2023-07-24 NOTE — Progress Notes (Signed)
 Anticoagulation monitoring(Lovenox):  68 yo female ordered Lovenox 40 mg Q24h    Filed Weights   07/23/23 2142  Weight: 112.6 kg (248 lb 4.8 oz)   BMI 48.5   Lab Results  Component Value Date   CREATININE 1.77 (H) 07/23/2023   CREATININE 1.47 (H) 11/23/2022   CREATININE 1.00 01/24/2019   Estimated Creatinine Clearance: 34.2 mL/min (A) (by C-G formula based on SCr of 1.77 mg/dL (H)). Hemoglobin & Hematocrit     Component Value Date/Time   HGB 8.9 (L) 07/23/2023 2221   HCT 30.7 (L) 07/23/2023 2221     Per Protocol for Patient with estCrcl > 30 ml/min and BMI > 30, will transition to Lovenox 57.5 mg Q24h.

## 2023-07-24 NOTE — Progress Notes (Signed)
 Communicated with charge nurse about my concern about patient's confusion after being placed on BIPAP. Patient may need more monitoring. While talking to RN, I had to return back to room because patient had displaced bipap mask. I explained to patient that she needed to keep mask on. No further intervention at this time.

## 2023-07-24 NOTE — Progress Notes (Signed)
 [facility] Patient Note Denise Knapp is a current hospice patient followed at [facility] for terminal dx:  Interstitial Pulmonary Disease.  She was admitted to [facility] 4.11.25 for DX weakness , & sepsis r/t  UTI.  This is a hospice related hospital admission per Dr. Alphonsus Sias, hospice physician.  Saw Denise Knapp this morning in the ED.  Patient is being admitted and waiting for bed assignment.  Patient was given morphine for shortness of breath in the setting of fever of 102.  Patient able to feed self with help setting up and cutting food.    Vital Signs: T 102  oral, BP 141/69, P- 109, R 30   Abnormal Labs:   Neutroabs 8.4, HGB 8.9, HCT 30.7, Na 131, Cl 93, Glucose 326. BUN 34.0, Creat 1.77 Ca 8.8  I&O:  none recorded  Diagnostics:  None  IV Meds: Morphine 4mg  @ 0341 Morphine 2mg  @ 0933 Rocephin 1g daily  Hospital Plan: per Dr. Lindajo Royal H&P 4.11.25  * Urinary tract infection without hematuria Possible sepsis At risk of severe infection due to chronic immunosuppression Borderline sepsis criteria include tachycardia with lactic acidosis and AKI, UTI Will get CXR and respiratory viral panel to evaluate for respiratory tract infection.  Patient presented with weakness only IV Rocephin Continue sepsis fluids Follow cultures   Uncontrolled type 2 diabetes mellitus with hyperglycemia, with long-term current use of insulin (HCC) Blood glucose over 300 Basal insulin Sliding scale insulin coverage Holding metformin   Pulmonary fibrosis, unspecified (HCC) OSA Chronic respiratory failure with hypoxia Patient appears to be at baseline Followed by pulmonology.  Holding CellCept due to acute infection Continue home inhalers Continue supplemental oxygen   Acute renal failure superimposed on stage 3a chronic kidney disease (HCC) Creatinine 1.77 up from baseline of 2 in October 2024 Expecting improvement with IV hydration Monitor renal function and avoid  nephrotoxins   Anemia Appears chronic: 8.9, down from 9.6 in October Will get anemia panel and serial H&H   Undifferentiated connective tissue disease (HCC) On long-term immunosuppression with CellCept and prednisone Not acutely flared Holding immunosuppressants due to acute infection   Mild cognitive disorder Depression Continue home meds, Risperdal, Aricept, fluoxetine and bupropion Delirium precautions   Obesity, Class III, BMI 40-49.9 (morbid obesity) (HCC) Complicating factor to overall prognosis and care      DVT prophylaxis: Lovenox   Consults: none   Advance Care Planning: full code   Family Communication: none   Disposition Plan: Back to previous home environment   Severity of Illness: The appropriate patient status for this patient is OBSERVATION. Observation status is judged to be reasonable and necessary in order to provide the required intensity of service to ensure the patient's safety. The patient's presenting symptoms, physical exam findings, and initial radiographic and laboratory data in the context of their medical condition is felt to place them at decreased risk for further clinical deterioration. Furthermore, it is anticipated that the patient will be medically stable for discharge from the hospital within 2 midnights of admission.  - END H&P-   Discharge Plan:  Back to [facility]  Goals of Care: Full Code with hospice  IDT:  report given  Family Contact:  none  Hospice medication list and Transfer Summary placed on shadow chart.  Please do not hesitate to call with any hospice related questions or concerns.  Norris Cross, RN Nurse Liaison [phone]

## 2023-07-24 NOTE — ED Notes (Signed)
 Pt cleaned up after she spilled her coffee on accident. New bed sheets, brief, and bed pads applied

## 2023-07-24 NOTE — Hospital Course (Signed)
 Marland Kitchen

## 2023-07-24 NOTE — H&P (Addendum)
 History and Physical    Patient: Denise Knapp ZOX:[id number] DOB: 1955/08/24 DOA: 07/23/2023 DOS: the patient was seen and examined on 07/24/2023 PCP: Mick Sell, MD  Patient coming from: ALF/ILF  Chief Complaint:  Chief Complaint  Patient presents with   Fall    HPI: Denise Knapp is a 68 y.o. 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  She presented from her facility with a several day history of progressive weakness, leading up to her fall.  She did not sustain any injuries.  She denies vomiting, diarrhea, abdominal pain, cough fever chills or chest pain. ED course and data review: Tachycardic to the 1 teens and tachypneic to the mid 20s, BP 121/47, afebrile, O2 sats in the high 90s on 4 L. Labs notable for normal WBC of 10,000 but with lactic acid of 2.3.  Urinalysis showing moderate leukocyte esterase with many bacteria. Respiratory viral panel not done Creatinine 1.77 up from baseline of 1.2 (01/2023), sodium 131 Hemoglobin 8.9 down from baseline of 9.3 Blood glucose 326 Troponin 20 EKG, personally viewed and interpreted showing sinus tachycardia at 103 with no concerning ST-T wave changes No imaging done  Patient given an LR bolus 2 L started on ceftriaxone Hospital course consulted for admission.   Review of Systems: As mentioned in the history of present illness. All other systems reviewed and are negative.  Past Medical History:  Diagnosis Date   Asthma    Depression    Diabetes mellitus without complication (HCC)    Hyperlipemia    Obesity    Pulmonary fibrosis, unspecified (HCC)    Respiratory bronchiolitis interstitial lung disease (HCC)    Sleep apnea    History reviewed. No pertinent surgical  history. Social History:  reports that she has never smoked. She has never used smokeless tobacco. She reports that she does not drink alcohol and does not use drugs.  Allergies  Allergen Reactions   Nintedanib Diarrhea and Nausea And Vomiting    Per pt report   Penicillins Itching and Other (See Comments)    Yeast infection Pt reports no known drug allergies, but chart from [facility] shows Penicillins Reports hx of yeast infection during course of therapy. Yeast infection Yeast infection Pt reports no known drug allergies, but chart from [facility] shows Penicillins Pt reports no known drug allergies, but chart from [facility] shows Penicillins     Family History  Problem Relation Age of Onset   Depression Mother    Mental illness Daughter    Mental illness Daughter    Breast cancer Paternal Aunt 6    Prior to Admission medications   Medication Sig Start Date End Date Taking? Authorizing Provider  albuterol (ACCUNEB) 1.25 MG/3ML nebulizer solution  08/10/19   [provider]  alendronate (FOSAMAX) 35 MG tablet Take by mouth. Patient not taking: Reported on 10/31/2020    [provider]  Apoaequorin 10 MG CAPS Take by mouth.    [provider]  ascorbic acid (VITAMIN C) 100 MG tablet Take by mouth.    [provider]  aspirin 81 MG EC tablet Take by mouth. 11/09/15   [provider]  b complex vitamins tablet Take by mouth.    [provider]  Blood Glucose Monitoring Suppl (FIFTY50 GLUCOSE METER 2.0) w/Device KIT Use  as instructed 04/27/18   [provider]  Blood Glucose Monitoring Suppl (ONETOUCH VERIO) w/Device KIT Use to test blood sugar 06/14/19   [provider]  budesonide (PULMICORT) 0.5 MG/2ML nebulizer solution  09/16/19   [provider]  budesonide (PULMICORT) 0.5 MG/2ML nebulizer solution Inhale into the lungs. 12/14/18 12/14/19  [provider]  buPROPion (WELLBUTRIN XL) 150 MG 24 hr tablet TAKE 2  TABLETS (300 MG TOTAL) BY MOUTH DAILY WITH BREAKFAST. 02/24/22   Jomarie Longs, MD  Calcium Carbonate-Vitamin D 600-200 MG-UNIT TABS Take by mouth. 04/28/19 04/27/20  [provider]  Cholecalciferol 25 MCG (1000 UT) capsule Take by mouth.    [provider]  Cysteamine Bitartrate (PROCYSBI) 300 MG PACK Use to test blood sugar daily 02/06/21   [provider]  donepezil (ARICEPT) 5 MG tablet Take by mouth. 03/13/20   [provider]  Dulaglutide 1.5 MG/0.5ML SOPN Inject into the skin. 06/14/19   [provider]  enalapril (VASOTEC) 2.5 MG tablet Take 2.5 mg by mouth daily. 06/24/19   [provider]  enalapril (VASOTEC) 2.5 MG tablet Take 1 tablet by mouth daily. 12/04/20   [provider]  ESBRIET 267 MG TABS  08/23/19   [provider]  famotidine (PEPCID) 40 MG tablet Take 40 mg by mouth daily. 06/24/19   [provider]  fluconazole (DIFLUCAN) 100 MG tablet Take by mouth. Patient not taking: Reported on 10/31/2020 12/14/19   [provider]  FLUoxetine (PROZAC) 40 MG capsule TAKE 1 CAPSULE (40 MG TOTAL) BY MOUTH DAILY. 03/10/22   Jomarie Longs, MD  fluticasone (FLONASE) 50 MCG/ACT nasal spray Place into the nose. 07/10/11   [provider]  glucose blood (PRECISION QID TEST) test strip by Other route 2 (two) times a day with meals. Please dispense 50 ultra fine lancets 04/27/18   [provider]  guaifenesin (ROBITUSSIN) 100 MG/5ML syrup Take by mouth. Patient not taking: Reported on 10/31/2020 04/27/18   [provider]  ibuprofen (ADVIL) 200 MG tablet Take by mouth.    [provider]  levothyroxine (SYNTHROID) 137 MCG tablet Take by mouth. 11/09/15   [provider]  levothyroxine (SYNTHROID) 137 MCG tablet Take by mouth. 09/19/20 09/19/21  [provider]  loperamide (IMODIUM) 2 MG capsule Take by mouth.    [provider]  lovastatin (MEVACOR) 40 MG  tablet Take 40 mg by mouth daily. 08/15/19   [provider]  meclizine (ANTIVERT) 25 MG tablet Take by mouth.    [provider]  metFORMIN (GLUCOPHAGE) 500 MG tablet TAKE 2 TABLETS BY MOUTH IN THE MORNING AND 1 TABLET IN THE EVENING 07/20/19   [provider]  metFORMIN (GLUCOPHAGE) 500 MG tablet Take 2 tablets by mouth daily. 04/04/21   [provider]  metoCLOPramide (REGLAN) 10 MG tablet Take 1 tablet (10 mg total) by mouth every 8 (eight) hours as needed for nausea. 11/24/22 12/24/22  Trinna Post, MD  Multiple Vitamin (MULTI-VITAMIN) tablet Take by mouth.    [provider]  mycophenolate (CELLCEPT) 500 MG tablet TAKE 2 TABLETS BY MOUTH 2 TIMES DAILY 08/11/19   [provider]  OFEV 150 MG CAPS  05/19/19   [provider]  omeprazole (PRILOSEC) 20 MG capsule Take by mouth.    [provider]  Dola Argyle Lancets 33G MISC  06/14/19   [provider]  Koren Bound test strip  04/13/19   [provider]  Lum Babe test strip  1 each daily. 09/09/19   [provider]  Pirfenidone (ESBRIET) 267 MG TABS Take by mouth. 06/20/19   [provider]  predniSONE (DELTASONE) 10 MG tablet  09/06/19   [provider]  risperiDONE (RISPERDAL) 2 MG tablet TAKE 1 TABLET BY MOUTH AT BEDTIME. 03/18/22   Jomarie Longs, MD  Sod Fluoride-Potassium Nitrate 1.1-5 % PSTE See admin instructions. 04/18/20   [provider]  sodium chloride (OCEAN) 0.65 % nasal spray Place into the nose.    [provider]  sulfamethoxazole-trimethoprim (BACTRIM DS) 800-160 MG tablet Take 1 tablet by mouth 3 (three) times a week. 11/09/21   [provider]  traMADol (ULTRAM) 50 MG tablet Take 50 mg by mouth every 6 (six) hours as needed. Patient not taking: Reported on 10/31/2020 12/12/19   [provider]  TRULICITY 1.5 MG/0.5ML SOPN SMARTSIG:0.5 Milliliter(s) SUB-Q Once a Week 09/01/19    [provider]  Vitamin D, Ergocalciferol, (DRISDOL) 1.25 MG (50000 UNIT) CAPS capsule  04/12/19   [provider]    Physical Exam: Vitals:   07/24/23 0100 07/24/23 0156 07/24/23 0200 07/24/23 0300  BP: (!) 152/54  (!) 162/66 135/72  Pulse:      Resp: (!) 30  20 (!) 26  Temp:  98.9 F (37.2 C)    TempSrc:  Oral    SpO2:      Weight:      Height:       Physical Exam Vitals and nursing note reviewed.  Constitutional:      Comments: Conversational dyspnea  HENT:     Head: Normocephalic and atraumatic.  Cardiovascular:     Rate and Rhythm: Regular rhythm. Tachycardia present.     Heart sounds: Normal heart sounds.  Pulmonary:     Effort: Pulmonary effort is normal. Tachypnea present.     Comments: Coarse breath sounds, tachypneic.  Speaking in short sentences.  Patient states this is how she breathes at baseline Abdominal:     Palpations: Abdomen is soft.     Tenderness: There is no abdominal tenderness.  Neurological:     Mental Status: Mental status is at baseline.     Labs on Admission: I have personally reviewed following labs and imaging studies  CBC: Recent Labs  Lab 07/23/23 2221  WBC 10.2  NEUTROABS 8.4*  HGB 8.9*  HCT 30.7*  MCV 87.0  PLT 255   Basic Metabolic Panel: Recent Labs  Lab 07/23/23 2221  NA 131*  K 4.7  CL 93*  CO2 27  GLUCOSE 326*  BUN 34*  CREATININE 1.77*  CALCIUM 8.8*   GFR: Estimated Creatinine Clearance: 34.2 mL/min (A) (by C-G formula based on SCr of 1.77 mg/dL (H)). Liver Function Tests: No results for input(s): "AST", "ALT", "ALKPHOS", "BILITOT", "PROT", "ALBUMIN" in the last 168 hours. No results for input(s): "LIPASE", "AMYLASE" in the last 168 hours. No results for input(s): "AMMONIA" in the last 168 hours. Coagulation Profile: No results for input(s): "INR", "PROTIME" in the last 168 hours. Cardiac Enzymes: No results for input(s): "CKTOTAL", "CKMB", "CKMBINDEX", "TROPONINI" in the last 168  hours. BNP (last 3 results) No results for input(s): "PROBNP" in the last 8760 hours. HbA1C: No results for input(s): "HGBA1C" in the last 72 hours. CBG: Recent Labs  Lab 07/23/23 2144  GLUCAP 322*   Lipid Profile: No results for input(s): "CHOL", "HDL", "LDLCALC", "TRIG", "CHOLHDL", "LDLDIRECT" in the last 72 hours. Thyroid Function Tests: No results for input(s): "TSH", "T4TOTAL", "FREET4", "T3FREE", "THYROIDAB" in  the last 72 hours. Anemia Panel: No results for input(s): "VITAMINB12", "FOLATE", "FERRITIN", "TIBC", "IRON", "RETICCTPCT" in the last 72 hours. Urine analysis:    Component Value Date/Time   COLORURINE YELLOW (A) 07/24/2023 0208   APPEARANCEUR TURBID (A) 07/24/2023 0208   LABSPEC 1.014 07/24/2023 0208   PHURINE 5.0 07/24/2023 0208   GLUCOSEU 50 (A) 07/24/2023 0208   HGBUR NEGATIVE 07/24/2023 0208   BILIRUBINUR NEGATIVE 07/24/2023 0208   KETONESUR NEGATIVE 07/24/2023 0208   PROTEINUR 30 (A) 07/24/2023 0208   NITRITE NEGATIVE 07/24/2023 0208   LEUKOCYTESUR MODERATE (A) 07/24/2023 0208    Radiological Exams on Admission: No results found.   Data Reviewed: Relevant notes from primary care and specialist visits, past discharge summaries as available in EHR, including Care Everywhere. Prior diagnostic testing as pertinent to current admission diagnoses Updated medications and problem lists for reconciliation ED course, including vitals, labs, imaging, treatment and response to treatment Triage notes, nursing and pharmacy notes and ED provider's notes Notable results as noted in HPI   Assessment and Plan: * Urinary tract infection without hematuria Possible sepsis At risk of severe infection due to chronic immunosuppression Borderline sepsis criteria include tachycardia with lactic acidosis and AKI, UTI Will get CXR and respiratory viral panel to evaluate for respiratory tract infection.  Patient presented with weakness only IV Rocephin Continue sepsis  fluids Follow cultures  Uncontrolled type 2 diabetes mellitus with hyperglycemia, with long-term current use of insulin (HCC) Blood glucose over 300 Basal insulin Sliding scale insulin coverage Holding metformin  Pulmonary fibrosis, unspecified (HCC) OSA Chronic respiratory failure with hypoxia Patient appears to be at baseline Followed by pulmonology.  Holding CellCept due to acute infection Continue home inhalers Continue supplemental oxygen  Acute renal failure superimposed on stage 3a chronic kidney disease (HCC) Creatinine 1.77 up from baseline of 2 in October 2024 Expecting improvement with IV hydration Monitor renal function and avoid nephrotoxins  Anemia Appears chronic: 8.9, down from 9.6 in October Will get anemia panel and serial H&H  Undifferentiated connective tissue disease (HCC) On long-term immunosuppression with CellCept and prednisone Not acutely flared Holding immunosuppressants due to acute infection  Mild cognitive disorder Depression Continue home meds, Risperdal, Aricept, fluoxetine and bupropion Delirium precautions  Obesity, Class III, BMI 40-49.9 (morbid obesity) (HCC) Complicating factor to overall prognosis and care    DVT prophylaxis: Lovenox  Consults: none  Advance Care Planning: full code  Family Communication: none  Disposition Plan: Back to previous home environment  Severity of Illness: The appropriate patient status for this patient is OBSERVATION. Observation status is judged to be reasonable and necessary in order to provide the required intensity of service to ensure the patient's safety. The patient's presenting symptoms, physical exam findings, and initial radiographic and laboratory data in the context of their medical condition is felt to place them at decreased risk for further clinical deterioration. Furthermore, it is anticipated that the patient will be medically stable for discharge from the hospital within 2  midnights of admission.   Author: Andris Baumann, MD 07/24/2023 3:42 AM  For on call review [email or web address].

## 2023-07-24 NOTE — Plan of Care (Signed)

## 2023-07-24 NOTE — Assessment & Plan Note (Signed)
 Complicating factor to overall prognosis and care

## 2023-07-24 NOTE — ED Notes (Signed)
 Dr Myriam Forehand notified of pts increased work of breathing and temperature of 102F.

## 2023-07-24 NOTE — Assessment & Plan Note (Signed)
 Blood glucose over 300 Basal insulin Sliding scale insulin coverage Holding metformin

## 2023-07-24 NOTE — Assessment & Plan Note (Signed)
 Creatinine 1.77 up from baseline of 2 in October 2024 Expecting improvement with IV hydration Monitor renal function and avoid nephrotoxins

## 2023-07-24 NOTE — Assessment & Plan Note (Addendum)
 OSA Chronic respiratory failure with hypoxia Patient appears to be at baseline Followed by pulmonology.  Holding CellCept due to acute infection Continue home inhalers Continue supplemental oxygen

## 2023-07-24 NOTE — Assessment & Plan Note (Addendum)
 Appears chronic: 8.9, down from 9.6 in October Will get anemia panel and serial H&H

## 2023-07-25 DIAGNOSIS — A419 Sepsis, unspecified organism: Secondary | ICD-10-CM | POA: Diagnosis not present

## 2023-07-25 DIAGNOSIS — R6520 Severe sepsis without septic shock: Secondary | ICD-10-CM | POA: Diagnosis not present

## 2023-07-25 LAB — CBC WITH DIFFERENTIAL/PLATELET
Abs Immature Granulocytes: 0.2 10*3/uL — ABNORMAL HIGH (ref 0.00–0.07)
Basophils Absolute: 0 10*3/uL (ref 0.0–0.1)
Basophils Relative: 0 %
Eosinophils Absolute: 0 10*3/uL (ref 0.0–0.5)
Eosinophils Relative: 0 %
HCT: 28.8 % — ABNORMAL LOW (ref 36.0–46.0)
Hemoglobin: 8.7 g/dL — ABNORMAL LOW (ref 12.0–15.0)
Immature Granulocytes: 2 %
Lymphocytes Relative: 10 %
Lymphs Abs: 1.3 10*3/uL (ref 0.7–4.0)
MCH: 24.9 pg — ABNORMAL LOW (ref 26.0–34.0)
MCHC: 30.2 g/dL (ref 30.0–36.0)
MCV: 82.5 fL (ref 80.0–100.0)
Monocytes Absolute: 1.6 10*3/uL — ABNORMAL HIGH (ref 0.1–1.0)
Monocytes Relative: 12 %
Neutro Abs: 9.8 10*3/uL — ABNORMAL HIGH (ref 1.7–7.7)
Neutrophils Relative %: 76 %
Platelets: 263 10*3/uL (ref 150–400)
RBC: 3.49 MIL/uL — ABNORMAL LOW (ref 3.87–5.11)
RDW: 16.5 % — ABNORMAL HIGH (ref 11.5–15.5)
WBC: 12.8 10*3/uL — ABNORMAL HIGH (ref 4.0–10.5)
nRBC: 0 % (ref 0.0–0.2)

## 2023-07-25 LAB — BASIC METABOLIC PANEL WITH GFR
Anion gap: 9 (ref 5–15)
BUN: 28 mg/dL — ABNORMAL HIGH (ref 8–23)
CO2: 28 mmol/L (ref 22–32)
Calcium: 8.9 mg/dL (ref 8.9–10.3)
Chloride: 95 mmol/L — ABNORMAL LOW (ref 98–111)
Creatinine, Ser: 1.54 mg/dL — ABNORMAL HIGH (ref 0.44–1.00)
GFR, Estimated: 36 mL/min — ABNORMAL LOW (ref >60)
Glucose, Bld: 386 mg/dL — ABNORMAL HIGH (ref 70–99)
Potassium: 5.2 mmol/L — ABNORMAL HIGH (ref 3.5–5.1)
Sodium: 132 mmol/L — ABNORMAL LOW (ref 135–145)

## 2023-07-25 LAB — GLUCOSE, CAPILLARY
Glucose-Capillary: 400 mg/dL — ABNORMAL HIGH (ref 70–99)
Glucose-Capillary: 530 mg/dL (ref 70–99)

## 2023-07-25 LAB — LACTIC ACID, PLASMA: Lactic Acid, Venous: 1.2 mmol/L (ref 0.5–1.9)

## 2023-07-25 MED ORDER — HALOPERIDOL LACTATE 5 MG/ML IJ SOLN: 2.5000 mg | INTRAMUSCULAR | Status: DC | PRN

## 2023-07-25 MED ORDER — SODIUM CHLORIDE 0.9 % IV SOLN: INTRAVENOUS | Status: DC

## 2023-07-25 MED ORDER — INSULIN GLARGINE-YFGN 100 UNIT/ML ~~LOC~~ SOLN
30.0000 [IU] | Freq: Two times a day (BID) | SUBCUTANEOUS | Status: DC
  Administered 2023-07-25: 30 [IU] via SUBCUTANEOUS
  Filled 2023-07-25 (×2): qty 0.3

## 2023-07-25 MED ORDER — MORPHINE SULFATE (PF) 2 MG/ML IV SOLN
2.0000 mg | INTRAVENOUS | Status: DC | PRN
  Administered 2023-07-25: 2 mg via INTRAVENOUS
  Filled 2023-07-25: qty 1

## 2023-07-25 MED ORDER — SODIUM ZIRCONIUM CYCLOSILICATE 10 G PO PACK
10.0000 g | PACK | Freq: Once | ORAL | Status: AC
  Administered 2023-07-25: 10 g via ORAL
  Filled 2023-07-25: qty 1

## 2023-07-25 MED ORDER — GLYCOPYRROLATE 0.2 MG/ML IJ SOLN: 0.2000 mg | INTRAMUSCULAR | Status: DC | PRN

## 2023-07-25 MED ORDER — GLYCOPYRROLATE 1 MG PO TABS: 1.0000 mg | ORAL_TABLET | ORAL | Status: DC | PRN

## 2023-07-25 MED ORDER — POLYVINYL ALCOHOL 1.4 % OP SOLN: 1.0000 [drp] | Freq: Four times a day (QID) | OPHTHALMIC | Status: DC | PRN

## 2023-07-25 NOTE — Progress Notes (Signed)
 Report called to [place] at hospice home.

## 2023-07-25 NOTE — TOC Transition Note (Signed)
 Transition of Care [facility]) - Discharge Note   Patient Details  Name: Denise Knapp MRN: [id number] Date of Birth: 01-02-1956  Transition of Care [facility] CM/SW Contact:  Melanny Wire E Makenleigh Crownover, LCSW Phone Number: 07/25/2023, 2:33 PM   Clinical Narrative:    Patient is discharging to [facility]. Deborrah Fam with [facility] has been in contact with the family and is arranging [facility] transport. EMS paperwork completed.    Final next level of care: Hospice Medical Facility Barriers to Discharge: Barriers Resolved   Patient Goals and CMS Choice            Discharge Placement                       Discharge Plan and Services Additional resources added to the After Visit Summary for                                       Social Drivers of Health (SDOH) Interventions SDOH Screenings   Food Insecurity: Unknown (04/24/2018)   Received from [facility]  Housing: Unknown (04/22/2023)   Received from [facility]  Depression [phone]): Medium Risk (01/13/2022)  Financial Resource Strain: Medium Risk (11/28/2019)  Tobacco Use: Low Risk  (07/23/2023)     Readmission Risk Interventions     No data to display

## 2023-07-25 NOTE — Discharge Summary (Addendum)
 Physician Discharge Summary   Patient: Denise Knapp MRN: [id number] DOB: 15-Jul-1955  Admit date:     07/23/2023  Discharge date: 07/25/23  Discharge Physician: Sheril Dines   PCP: Eartha Gold, MD   Recommendations at discharge:   Follow-up with hospice team at the hospice house  Discharge Diagnoses: Principal Problem:   Severe sepsis [facility]) Active Problems:   Urinary tract infection without hematuria   Pulmonary fibrosis, unspecified (HCC)   Uncontrolled type 2 diabetes mellitus with hyperglycemia, with long-term current use of insulin (HCC)   Chronic respiratory failure with hypoxia (HCC)   OSA (obstructive sleep apnea)   Acute renal failure superimposed on stage 3a chronic kidney disease (HCC)   Long term (current) use of other immunomodulators and immunosuppressants   Anemia   Undifferentiated connective tissue disease (HCC)   Obesity, Class III, BMI 40-49.9 (morbid obesity) (HCC)   Mild cognitive disorder   Acute cystitis  Resolved Problems:   * No resolved hospital problems. *  Hospital Course:  Denise Knapp is a 68 y.o. 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  Reportedly, she fell after slipping on a rug while going to the bathroom.  She has a complaint of general weakness for the past few days prior to admission.  She was recently diagnosed with UTI but had not started antibiotics.  She was on hospice at [facility].   She was admitted to the hospital for severe sepsis secondary to acute UTI.      Assessment and Plan:   Fever, severe sepsis secondary to acute UTI, immunocompromised: Urine culture growing gram-negative rods.   Lactic acid  down from 3.2-2.3-2.1 Discontinue antibiotics.  Patient and family have decided to transition to comfort care with plan to discharge to hospice house today.     Type II DM with severe hyperglycemia: Discontinue insulin and other hypoglycemic agents.  Patient is now on comfort care Hemoglobin A1c is pending     AKI on CKD stage IIIb: IV fluids has been discontinued Persistent hyperkalemia: She was treated with Lokelma Persistent hyponatremia: IV fluids has been discontinued     Pulmonary fibrosis/ILD, OSA, chronic hypoxic respiratory failure: She is on 4 L/min oxygen via [place].   Bronchodilators as needed.     Undifferentiated connective tissue disease: CellCept and prednisone have been held because of acute infection.     Comorbidities include depression, mild cognitive impairment     Patient changed her CODE STATUS to DNR.  She has decided to transition to comfort care and go to the hospice house to monitor symptoms.  Discharge plan was discussed with patient, [name] (daughter who was on speaker phone), Tyra Galley (sister at the bedside) and [name] (hospice nurse at the bedside)      Consultants: Hospice Procedures performed: None Disposition: Hospice care Diet recommendation:  Discharge Diet Orders (From admission, onward)     Start     Ordered   07/25/23 0000  Diet general        07/25/23 1446           Regular diet DISCHARGE MEDICATION: Allergies as of 07/25/2023       Reactions   Nintedanib Diarrhea, Nausea And Vomiting   Per pt report   Penicillins Itching, Other (See  Comments)   Yeast infection Pt reports no known drug allergies, but chart from [facility] shows Penicillins Reports hx of yeast infection during course of therapy. Yeast infection Yeast infection Pt reports no known drug allergies, but chart from [facility] shows Penicillins Pt reports no known drug allergies, but chart from [facility] shows Penicillins        Medication List     STOP taking these medications     acetaminophen 500 MG tablet Commonly known as: TYLENOL   albuterol 1.25 MG/3ML nebulizer solution Commonly known as: ACCUNEB   albuterol 108 (90 Base) MCG/ACT inhaler Commonly known as: VENTOLIN HFA   aspirin EC 81 MG tablet   b complex vitamins tablet   buPROPion 150 MG 24 hr tablet Commonly known as: WELLBUTRIN XL   carboxymethylcellulose 0.5 % Soln Commonly known as: REFRESH PLUS   cefdinir 300 MG capsule Commonly known as: OMNICEF   donepezil 5 MG tablet Commonly known as: ARICEPT   Dulaglutide 4.5 MG/0.5ML Soaj   enalapril 2.5 MG tablet Commonly known as: VASOTEC   Fifty50 Glucose Meter 2.0 w/Device Kit   FLUoxetine 40 MG capsule Commonly known as: PROZAC   fluticasone 50 MCG/ACT nasal spray Commonly known as: FLONASE   glimepiride 4 MG tablet Commonly known as: AMARYL   guaiFENesin 600 MG 12 hr tablet Commonly known as: MUCINEX   HumaLOG KwikPen 100 UNIT/ML KwikPen Generic drug: insulin lispro   Lantus SoloStar 100 UNIT/ML Solostar Pen Generic drug: insulin glargine   levothyroxine 137 MCG tablet Commonly known as: SYNTHROID   loperamide 2 MG capsule Commonly known as: IMODIUM   Loratadine 10 MG Caps   lovastatin 40 MG tablet Commonly known as: MEVACOR   Multi-Vitamin tablet   nystatin cream Commonly known as: MYCOSTATIN   OneTouch Delica Lancets 33G Misc   OneTouch Ultra test strip Generic drug: glucose blood   OneTouch Verio test strip Generic drug: glucose blood   OneTouch Verio w/Device Kit   Precision QID Test test strip Generic drug: glucose blood   predniSONE 10 MG tablet Commonly known as: DELTASONE   risperiDONE 2 MG tablet Commonly known as: RISPERDAL   saline Gel   torsemide 20 MG tablet Commonly known as: DEMADEX   Zinc Oxide 22 % Crea       TAKE these medications    Ativan 0.5 MG tablet Generic drug: LORazepam Take 0.5 mg by mouth 2 (two) times daily. In addition, pt may take 0.5 mg Ativan q4h  PRN   morphine CONCENTRATE 10 mg / 0.5 ml concentrated solution Take 0.25 mLs by mouth every 8 (eight) hours as needed for moderate pain (pain score 4-6), severe pain (pain score 7-10) or shortness of breath.        Discharge Exam: Filed Weights   07/23/23 2142  Weight: 112.6 kg   GEN: NAD, mild respiratory distress SKIN: Warm and dry EYES: No pallor or icterus ENT: MMM CV: RRR PULM: Bibasilar rales, no wheezing ABD: soft, obese, NT, +BS CNS: AAO x 3, non focal EXT: No edema or tenderness   Condition at discharge: stable  The results of significant diagnostics from this hospitalization (including imaging, microbiology, ancillary and laboratory) are listed below for reference.   Imaging Studies: DG Chest Port 1 View Result Date: 07/24/2023 CLINICAL DATA:  Tachycardia EXAM: PORTABLE CHEST 1 VIEW COMPARISON:  09/03/2021 FINDINGS: Cardiomegaly with vascular congestion. Mild diffuse interstitial and hazy pulmonary density, possible edema. No pleural effusion or pneumothorax. Aortic atherosclerosis IMPRESSION: Cardiomegaly with vascular congestion and  possible mild edema. Electronically Signed   By: Esmeralda Hedge M.D.   On: 07/24/2023 03:49    Microbiology: Results for orders placed or performed during the hospital encounter of 07/23/23  Blood culture (routine x 2)     Status: None (Preliminary result)   Collection Time: 07/23/23 10:30 PM   Specimen: BLOOD  Result Value Ref Range Status   Specimen Description BLOOD BLOOD LEFT FOREARM  Final   Special Requests   Final    BOTTLES DRAWN AEROBIC AND ANAEROBIC Blood Culture results may not be optimal due to an excessive volume of blood received in culture bottles   Culture   Final    NO GROWTH 2 DAYS Performed at [facility], [address], [place], [place] [address]    Report Status PENDING  Incomplete  Urine Culture     Status: Abnormal (Preliminary result)   Collection Time: 07/24/23  2:06 AM   Specimen: In/Out  Cath Urine  Result Value Ref Range Status   Specimen Description   Final    IN/OUT CATH URINE Performed at [facility], [address] [place], [place] [address]    Special Requests   Final    NONE Performed at [facility], [address] [place], [place] [address]    Culture (A)  Final    70,000 COLONIES/mL Hillis Lu NEGATIVE RODS IDENTIFICATION AND SUSCEPTIBILITIES TO FOLLOW Performed at [facility], [address], [place], [place] [address]    Report Status PENDING  Incomplete  Resp panel by RT-PCR (RSV, Flu A&B, Covid) Anterior Nasal Swab     Status: None   Collection Time: 07/24/23  3:45 AM   Specimen: Anterior Nasal Swab  Result Value Ref Range Status   SARS Coronavirus 2 by RT PCR NEGATIVE NEGATIVE Final    Comment: (NOTE) SARS-CoV-2 target nucleic acids are NOT DETECTED.  The SARS-CoV-2 RNA is generally detectable in upper respiratory specimens during the acute phase of infection. The lowest concentration of SARS-CoV-2 viral copies this assay can detect is 138 copies/mL. A negative result does not preclude SARS-Cov-2 infection and should not be used as the sole basis for treatment or other patient management decisions. A negative result may occur with  improper specimen collection/handling, submission of specimen other than nasopharyngeal swab, presence of viral mutation(s) within the areas targeted by this assay, and inadequate number of viral copies(<138 copies/mL). A negative result must be combined with clinical observations, patient history, and epidemiological information. The expected result is Negative.  Fact Sheet for Patients:  BloggerCourse.com  Fact Sheet for Healthcare Providers:  SeriousBroker.it  This test is no t yet approved or cleared by the [place]  FDA and  has been authorized for detection and/or diagnosis of SARS-CoV-2 by FDA under an Emergency Use Authorization  (EUA). This EUA will remain  in effect (meaning this test can be used) for the duration of the COVID-19 declaration under Section 564(b)(1) of the Act, 21 U.S.C.section 360bbb-3(b)(1), unless the authorization is terminated  or revoked sooner.       Influenza A by PCR NEGATIVE NEGATIVE Final   Influenza B by PCR NEGATIVE NEGATIVE Final    Comment: (NOTE) The Xpert Xpress SARS-CoV-2/FLU/RSV plus assay is intended as an aid in the diagnosis of influenza from Nasopharyngeal swab specimens and should not be used as a sole basis for treatment. Nasal washings and aspirates are unacceptable for Xpert Xpress SARS-CoV-2/FLU/RSV testing.  Fact Sheet for Patients: BloggerCourse.com  Fact Sheet for Healthcare  Providers: SeriousBroker.it  This test is not yet approved or cleared by the [place]  FDA and has been authorized for detection and/or diagnosis of SARS-CoV-2 by FDA under an Emergency Use Authorization (EUA). This EUA will remain in effect (meaning this test can be used) for the duration of the COVID-19 declaration under Section 564(b)(1) of the Act, 21 U.S.C. section 360bbb-3(b)(1), unless the authorization is terminated or revoked.     Resp Syncytial Virus by PCR NEGATIVE NEGATIVE Final    Comment: (NOTE) Fact Sheet for Patients: BloggerCourse.com  Fact Sheet for Healthcare Providers: SeriousBroker.it  This test is not yet approved or cleared by the [place]  FDA and has been authorized for detection and/or diagnosis of SARS-CoV-2 by FDA under an Emergency Use Authorization (EUA). This EUA will remain in effect (meaning this test can be used) for the duration of the COVID-19 declaration under Section 564(b)(1) of the Act, 21 U.S.C. section 360bbb-3(b)(1), unless the authorization is terminated or revoked.  Performed at [facility], [address]  [place], [place] [address]     Labs: CBC: Recent Labs  Lab 07/23/23 2221 07/24/23 1031 07/25/23 0525  WBC 10.2 11.0* 12.8*  NEUTROABS 8.4* 8.4* 9.8*  HGB 8.9* 8.7* 8.7*  HCT 30.7* 28.8* 28.8*  MCV 87.0 86.2 82.5  PLT 255 258 263   Basic Metabolic Panel: Recent Labs  Lab 07/23/23 2221 07/24/23 1031 07/25/23 0525  NA 131* 130* 132*  K 4.7 5.2* 5.2*  CL 93* 92* 95*  CO2 27 29 28   GLUCOSE 326* 453* 386*  BUN 34* 28* 28*  CREATININE 1.77* 1.47* 1.54*  CALCIUM 8.8* 8.7* 8.9   Liver Function Tests: Recent Labs  Lab 07/24/23 1031  AST 44*  ALT 37  ALKPHOS 66  BILITOT 0.3  PROT 7.0  ALBUMIN 3.0*   CBG: Recent Labs  Lab 07/24/23 1216 07/24/23 1649 07/24/23 2107 07/25/23 0743 07/25/23 1146  GLUCAP 384* 390* 338* 400* 530*    Discharge time spent: greater than 30 minutes.  Signed: Sheril Dines, MD [facility] 07/25/2023

## 2023-07-25 NOTE — Progress Notes (Signed)
 [facility] Patient Note  [name] is a current hospice patient followed at [facility] for terminal dx:  Interstitial Pulmonary Disease.  She was admitted to [facility].11[facility] for DX weakness , & sepsis r/t  UTI.  This is a hospice related hospital admission per Dr. Mitcheal Amy, hospice physician.  Met at bedside this morning with Denise Knapp, her Knapp, Denise Knapp and her daughter, Denise Knapp (on speaker phone) to discuss goals of care.   Discussed current disease process with decline and natural trajectory of her disease.  Denise Knapp is extremely short of breath at rest on 3 L/O2.  She drifts in and out of alertness, but when you call her name, she opens her eyes and will answer your questions.  It is difficult for her to talk due to shortness of breath.  Discussed current hospice status and with hospice she chose to focus on managing her symptoms and no longer treating the disease.  Also discussed her high level of symptom burden at this point that is not being managed well.  She endorses that she would like to be a DNR- confirmed with her Knapp and daughter listening and that she wishes to transition to the Hospice IPU for symptom management.  Dr. Leory Rands entered the room at the end of our discussion.  I informed him of the discussion we had and he then confirms with the patient, daughter and Knapp DNR and moving to full comfort care.  Stopping IV antibiotics and treat her symptoms.  All confirmed including patient.  Patient has been approved for GIP LOC at the hospice inpatient unit by Dr. Jerlyn Moons, hospice MD.  RN to call report to [phone]  EMS will be arranged for pick up by this RN.   Vital Signs: T 98.8 oral, BP 169/72, P 110, R 19, Oxi 95% on 4L O2via [place]   Abnormal Labs:   Na 132, K 5.2, Chloride 95, Glucose 386, BUN 28, Creat 1.54, GFR 36, BC 12.8, RBC 3.49, HGB 8.7, HCT 28.8, MCH 24.9, RDW 16.5, NEUT# 9.8, Monocyte# 1.6, Abs Immature Granulocytes 0.20      Discharge Plan:  Hospice Home for symptom management of sob   Goals of Care: DNR   IDT:  report given   Family Contact:  Denise Knapp Denise Knapp.   Please do not hesitate to call with any hospice related questions or concerns.   Denise Junker, RN Nurse Liaison [phone]

## 2023-07-25 NOTE — IPAL (Signed)
  Interdisciplinary Goals of Care Family Meeting   Date carried out: 07/25/2023  Location of the meeting: Bedside  Member's involved: Physician, Family Member or next of kin, and Other: Denise Knapp (daughter) on speaker phone , Denise Knapp (daughter) at the bedside [name] (hospice nurse) at the bedside  Durable Power of Attorney or acting medical decision maker: Patient, Denise Knapp (daughter) and Denise Knapp (sister) have decided to proceed with comfort care and transition to hospice house to manage her symptoms.  Patient has decided to become DNR and her family is in agreement with the plan.  Discussion: We discussed diagnoses, prognosis and goals of care for [facility] .    Code status:   Code Status: Full Code   Disposition: In-patient comfort care, hospice house  Time spent for the meeting: 10 minutes    Sheril Dines, MD  07/25/2023, 1:26 PM

## 2023-07-27 LAB — URINE CULTURE: Culture: 70000 — AB

## 2023-07-27 LAB — HEMOGLOBIN A1C
Hgb A1c MFr Bld: 11.6 % — ABNORMAL HIGH (ref 4.8–5.6)
Mean Plasma Glucose: 286 mg/dL

## 2023-07-28 LAB — CULTURE, BLOOD (ROUTINE X 2): Culture: NO GROWTH

## 2024-05-15 DEATH — deceased
